# Patient Record
Sex: Male | Born: 1980 | Race: Black or African American | Hispanic: No | Marital: Married | Smoking: Former smoker
Health system: Southern US, Community
[De-identification: ages and names within clinical notes are randomized; demographics above are authoritative.]

## PROBLEM LIST (undated history)

## (undated) DIAGNOSIS — E785 Hyperlipidemia, unspecified: Secondary | ICD-10-CM

## (undated) DIAGNOSIS — E7800 Pure hypercholesterolemia, unspecified: Secondary | ICD-10-CM

## (undated) DIAGNOSIS — R200 Anesthesia of skin: Secondary | ICD-10-CM

## (undated) DIAGNOSIS — D649 Anemia, unspecified: Secondary | ICD-10-CM

## (undated) DIAGNOSIS — F988 Other specified behavioral and emotional disorders with onset usually occurring in childhood and adolescence: Secondary | ICD-10-CM

## (undated) DIAGNOSIS — I10 Essential (primary) hypertension: Secondary | ICD-10-CM

## (undated) DIAGNOSIS — N3281 Overactive bladder: Secondary | ICD-10-CM

## (undated) HISTORY — DX: Other specified behavioral and emotional disorders with onset usually occurring in childhood and adolescence: F98.8

## (undated) HISTORY — DX: Anemia, unspecified: D64.9

## (undated) HISTORY — DX: Hyperlipidemia, unspecified: E78.5

## (undated) HISTORY — DX: Overactive bladder: N32.81

## (undated) HISTORY — DX: Pure hypercholesterolemia, unspecified: E78.00

## (undated) HISTORY — DX: Anesthesia of skin: R20.0

## (undated) HISTORY — DX: Essential (primary) hypertension: I10

---

## 2003-04-22 ENCOUNTER — Encounter: Payer: Self-pay | Admitting: Emergency Medicine

## 2003-04-22 ENCOUNTER — Emergency Department (HOSPITAL_COMMUNITY): Admission: EM | Admit: 2003-04-22 | Discharge: 2003-04-22 | Payer: Self-pay | Admitting: Emergency Medicine

## 2003-05-01 ENCOUNTER — Emergency Department (HOSPITAL_COMMUNITY): Admission: EM | Admit: 2003-05-01 | Discharge: 2003-05-01 | Payer: Self-pay | Admitting: Emergency Medicine

## 2005-09-14 ENCOUNTER — Ambulatory Visit: Payer: Self-pay | Admitting: Family Medicine

## 2005-10-11 ENCOUNTER — Ambulatory Visit: Payer: Self-pay | Admitting: Family Medicine

## 2007-05-11 DIAGNOSIS — I10 Essential (primary) hypertension: Secondary | ICD-10-CM | POA: Insufficient documentation

## 2007-05-11 DIAGNOSIS — E785 Hyperlipidemia, unspecified: Secondary | ICD-10-CM | POA: Insufficient documentation

## 2012-04-08 ENCOUNTER — Emergency Department (HOSPITAL_COMMUNITY): Payer: No Typology Code available for payment source

## 2012-04-08 ENCOUNTER — Encounter (HOSPITAL_COMMUNITY): Payer: Self-pay | Admitting: *Deleted

## 2012-04-08 ENCOUNTER — Emergency Department (HOSPITAL_COMMUNITY)
Admission: EM | Admit: 2012-04-08 | Discharge: 2012-04-08 | Disposition: A | Discharge status: Home / Self Care | Payer: No Typology Code available for payment source | Attending: Emergency Medicine | Admitting: Emergency Medicine

## 2012-04-08 DIAGNOSIS — Y92410 Unspecified street and highway as the place of occurrence of the external cause: Secondary | ICD-10-CM | POA: Insufficient documentation

## 2012-04-08 DIAGNOSIS — S5001XA Contusion of right elbow, initial encounter: Secondary | ICD-10-CM

## 2012-04-08 DIAGNOSIS — S139XXA Sprain of joints and ligaments of unspecified parts of neck, initial encounter: Secondary | ICD-10-CM | POA: Insufficient documentation

## 2012-04-08 DIAGNOSIS — S161XXA Strain of muscle, fascia and tendon at neck level, initial encounter: Secondary | ICD-10-CM

## 2012-04-08 DIAGNOSIS — R29898 Other symptoms and signs involving the musculoskeletal system: Secondary | ICD-10-CM | POA: Insufficient documentation

## 2012-04-08 DIAGNOSIS — M25529 Pain in unspecified elbow: Secondary | ICD-10-CM | POA: Insufficient documentation

## 2012-04-08 DIAGNOSIS — S5000XA Contusion of unspecified elbow, initial encounter: Secondary | ICD-10-CM | POA: Insufficient documentation

## 2012-04-08 DIAGNOSIS — M542 Cervicalgia: Secondary | ICD-10-CM | POA: Insufficient documentation

## 2012-04-08 MED ORDER — HYDROCODONE-ACETAMINOPHEN 5-325 MG PO TABS: 1.0000 | ORAL_TABLET | ORAL | Status: AC | PRN

## 2012-04-08 MED ORDER — OXYCODONE-ACETAMINOPHEN 5-325 MG PO TABS
2.0000 | ORAL_TABLET | Freq: Once | ORAL | Status: AC
  Administered 2012-04-08: 2 via ORAL
  Filled 2012-04-08: qty 2
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
## 2019-09-28 ENCOUNTER — Ambulatory Visit: Payer: 59

## 2020-06-06 ENCOUNTER — Telehealth: Payer: Self-pay | Admitting: Psychologist

## 2020-10-03 ENCOUNTER — Ambulatory Visit: Payer: No Typology Code available for payment source | Admitting: Diagnostic Neuroimaging

## 2020-10-20 ENCOUNTER — Encounter: Payer: Self-pay | Admitting: *Deleted

## 2020-10-22 ENCOUNTER — Ambulatory Visit (INDEPENDENT_AMBULATORY_CARE_PROVIDER_SITE_OTHER): Payer: No Typology Code available for payment source | Admitting: Diagnostic Neuroimaging

## 2020-10-22 ENCOUNTER — Encounter: Payer: Self-pay | Admitting: Diagnostic Neuroimaging

## 2020-10-22 VITALS — BP 135/75 | HR 112 | Ht 70.0 in | Wt 349.4 lb

## 2020-10-22 DIAGNOSIS — G5712 Meralgia paresthetica, left lower limb: Secondary | ICD-10-CM

## 2020-10-22 NOTE — Progress Notes (Signed)
[facility]  PATIENT: Connor Dennis DOB: [birth year]  REFERRING CLINICIAN: Pearson Grippe, MD HISTORY FROM: patient  REASON FOR VISIT: new consult    HISTORICAL  CHIEF COMPLAINT:  Chief Complaint  Patient presents with   Left thigh numbness    Rm 7 New Pt "hx meniscus tear in left knee x 2; left thigh numbness x 1 year"     HISTORY OF PRESENT ILLNESS:   40 year old male here for evaluation of left thigh numbness.  Patient is had left thigh numbness for about 1 year.  He has tried yoga, nutrition, exercise and stretching without relief.  He does not have any significant low back pain.  No problems below his knee.  He feels some numbness and burning sensation with certain positions and stretches.   REVIEW OF SYSTEMS: Full 14 system review of systems performed and negative with exception of: As per HPI.  ALLERGIES: No Known Allergies  HOME MEDICATIONS: Outpatient Medications Prior to Visit  Medication Sig Dispense Refill   amLODipine (NORVASC) 10 MG tablet Take 10 mg by mouth daily.     amphetamine-dextroamphetamine (ADDERALL) 30 MG tablet Take 1 tablet by mouth daily.     No facility-administered medications prior to visit.    PAST MEDICAL HISTORY: Past Medical History:  Diagnosis Date   ADD (attention deficit disorder)    Anemia    Hypercholesteremia    Hyperlipidemia    Hypertension    Numbness    OAB (overactive bladder)     PAST SURGICAL HISTORY: No past surgical history on file.  FAMILY HISTORY: Family History  Problem Relation Age of Onset   Diabetes Father     SOCIAL HISTORY: Social History   Socioeconomic History   Marital status: Married    Spouse name: Faith   Number of children: 2   Years of education: Not on file   Highest education level: Bachelor's degree (e.g., BA, AB, BS)  Occupational History   Not on file  Tobacco Use   Smoking status: Former Smoker    Packs/day: 0.20    Years: 3.00    Pack  years: 0.60    Types: Cigarettes    Quit date: 04/08/2004    Years since quitting: 16.5   Smokeless tobacco: Never Used  Substance and Sexual Activity   Alcohol use: Yes    Alcohol/week: 3.0 standard drinks    Types: 3 Cans of beer per week    Comment: 2 days week   Drug use: No   Sexual activity: Yes    Birth control/protection: None  Other Topics Concern   Not on file  Social History Narrative   Lives with wife, sons   Caffeine- coffee 1 1/2 c daily, 8 oz tea   Social Determinants of Health   Financial Resource Strain: Not on file  Food Insecurity: Not on file  Transportation Needs: Not on file  Physical Activity: Not on file  Stress: Not on file  Social Connections: Not on file  Intimate Partner Violence: Not on file     PHYSICAL EXAM  GENERAL EXAM/CONSTITUTIONAL: Vitals:  Vitals:   10/22/20 1100  BP: 135/75  Pulse: (!) 112  Weight: (!) 349 lb 6.4 oz (158.5 kg)  Height: 5\' 10"  (1.778 m)   Body mass index is 50.13 kg/m. Wt Readings from Last 3 Encounters:  10/22/20 (!) 349 lb 6.4 oz (158.5 kg)  04/08/12 298 lb (135.2 kg)    Patient is in no distress; well developed, nourished and groomed;  neck is supple  CARDIOVASCULAR:  Examination of carotid arteries is normal; no carotid bruits  Regular rate and rhythm, no murmurs  Examination of peripheral vascular system by observation and palpation is normal  EYES:  Ophthalmoscopic exam of optic discs and posterior segments is normal; no papilledema or hemorrhages No exam data present  MUSCULOSKELETAL:  Gait, strength, tone, movements noted in Neurologic exam below  NEUROLOGIC: MENTAL STATUS:  No flowsheet data found.  awake, alert, oriented to person, place and time  recent and remote memory intact  normal attention and concentration  language fluent, comprehension intact, naming intact  fund of knowledge appropriate  CRANIAL NERVE:   2nd - no papilledema on fundoscopic exam  2nd, 3rd,  4th, 6th - pupils equal and reactive to light, visual fields full to confrontation, extraocular muscles intact, no nystagmus  5th - facial sensation symmetric  7th - facial strength symmetric  8th - hearing intact  9th - palate elevates symmetrically, uvula midline  11th - shoulder shrug symmetric  12th - tongue protrusion midline  MOTOR:   normal bulk and tone, full strength in the BUE, BLE  SENSORY:   normal and symmetric to light touch, temperature, vibration; DECREASED SENSATION IN LEFT ANTEROLATERAL THIGH  COORDINATION:   finger-nose-finger, fine finger movements normal  REFLEXES:   deep tendon reflexes present and symmetric  GAIT/STATION:   narrow based gait     DIAGNOSTIC DATA (LABS, IMAGING, TESTING) - I reviewed patient records, labs, notes, testing and imaging myself where available.  No results found for: WBC, HGB, HCT, MCV, PLT No results found for: NA, K, CL, CO2, GLUCOSE, BUN, CREATININE, CALCIUM, PROT, ALBUMIN, AST, ALT, ALKPHOS, BILITOT, GFRNONAA, GFRAA No results found for: CHOL, HDL, LDLCALC, LDLDIRECT, TRIG, CHOLHDL No results found for: BJSE8B No results found for: VITAMINB12 No results found for: TSH      ASSESSMENT AND PLAN  40 y.o. year old male here with obesity with BMI of 50, with left anterolateral thigh numbness, consistent with meralgia paresthetica.  Dx:  1. Meralgia paresthetica of left side       PLAN:  - Advised patient to continue to improve nutrition, exercise, weight loss and change in body composition will be the long-term solution for this; no significant pain therefore do not recommend pain medicines or injections. - Symptoms significant worsen or patient develops muscle weakness, could consider MRI lumbar spine to rule out other causes  Return for pending if symptoms worsen or fail to improve, return to PCP.    Suanne Marker, MD 10/22/2020, 1:43 PM Certified in Neurology, Neurophysiology and  Neuroimaging  [facility] [address] [place], [place] [address] [phone]

## 2020-10-22 NOTE — Patient Instructions (Signed)
Thank you for coming to see [place] at [facility]. I hope we have been able to provide you high quality care today.  You may receive a patient satisfaction survey over the next few weeks. We would appreciate your feedback and comments so that we may continue to improve ourselves and the health of our patients.   ~~~~~~~~~~~~~~~~~~~~~~~~~~~~~~~~~~~~~~~~~~~~~~~~~~~~~~~~~~~~~~~~~  DR. Abbagail Scaff'S GUIDE TO HAPPY AND HEALTHY LIVING These are some of my general health and wellness recommendations. Some of them may apply to you better than others. Please use common sense as you try these suggestions and feel free to ask me any questions.   ACTIVITY/FITNESS Mental, social, emotional and physical stimulation are very important for brain and body health. Try learning a new activity (arts, music, language, sports, games).  Keep moving your body to the best of your abilities. You can do this at home, inside or outside, the park, community center, gym or anywhere you like. Consider a physical therapist or personal trainer to get started. Fitness trackers, smart-watches or  smart-phones can help as well.   NUTRITION Eat more plants: colorful vegetables, nuts, seeds and berries.  Eat less sugar, salt, preservatives and processed foods.  Avoid toxins such as cigarettes and alcohol.  Drink water when you are thirsty. Warm water with a slice of lemon is an excellent morning drink to start the day.  Consider these websites for more information [employer] Nutrition [employer] (https://www.henry-hernandez.biz/) [employer] Nutrition (WindowBlog.ch)   RELAXATION Consider practicing mindfulness meditation or other relaxation techniques such as deep breathing, prayer, yoga, tai chi, massage. See website mindful.org or the apps Headspace or Calm to help get started.   SLEEP Try to get at least 7-8+ hours sleep per day. Regular exercise and reduced caffeine will  help you sleep better. Practice good sleep hygeine techniques. See website sleep.org for more information.

## 2020-10-24 DIAGNOSIS — M1712 Unilateral primary osteoarthritis, left knee: Secondary | ICD-10-CM | POA: Insufficient documentation

## 2020-11-11 ENCOUNTER — Other Ambulatory Visit: Payer: Self-pay | Admitting: Internal Medicine

## 2020-11-11 DIAGNOSIS — E785 Hyperlipidemia, unspecified: Secondary | ICD-10-CM

## 2020-12-31 ENCOUNTER — Ambulatory Visit
Admission: RE | Admit: 2020-12-31 | Discharge: 2020-12-31 | Disposition: A | Discharge status: Home / Self Care | Payer: No Typology Code available for payment source | Source: Ambulatory Visit | Attending: Internal Medicine | Admitting: Internal Medicine

## 2020-12-31 DIAGNOSIS — E785 Hyperlipidemia, unspecified: Secondary | ICD-10-CM

## 2021-09-10 ENCOUNTER — Inpatient Hospital Stay (HOSPITAL_BASED_OUTPATIENT_CLINIC_OR_DEPARTMENT_OTHER)
Admission: EM | Admit: 2021-09-10 | Discharge: 2021-09-13 | DRG: 638 | Disposition: A | Discharge status: Home / Self Care | Payer: No Typology Code available for payment source | Attending: Internal Medicine | Admitting: Internal Medicine

## 2021-09-10 ENCOUNTER — Encounter (HOSPITAL_BASED_OUTPATIENT_CLINIC_OR_DEPARTMENT_OTHER): Payer: Self-pay

## 2021-09-10 ENCOUNTER — Other Ambulatory Visit: Payer: Self-pay

## 2021-09-10 DIAGNOSIS — N3281 Overactive bladder: Secondary | ICD-10-CM | POA: Diagnosis present

## 2021-09-10 DIAGNOSIS — K219 Gastro-esophageal reflux disease without esophagitis: Secondary | ICD-10-CM | POA: Diagnosis present

## 2021-09-10 DIAGNOSIS — N179 Acute kidney failure, unspecified: Secondary | ICD-10-CM | POA: Diagnosis present

## 2021-09-10 DIAGNOSIS — E785 Hyperlipidemia, unspecified: Secondary | ICD-10-CM | POA: Diagnosis present

## 2021-09-10 DIAGNOSIS — E119 Type 2 diabetes mellitus without complications: Secondary | ICD-10-CM

## 2021-09-10 DIAGNOSIS — R5383 Other fatigue: Secondary | ICD-10-CM | POA: Diagnosis not present

## 2021-09-10 DIAGNOSIS — D509 Iron deficiency anemia, unspecified: Secondary | ICD-10-CM | POA: Diagnosis present

## 2021-09-10 DIAGNOSIS — E7800 Pure hypercholesterolemia, unspecified: Secondary | ICD-10-CM | POA: Diagnosis present

## 2021-09-10 DIAGNOSIS — Z20822 Contact with and (suspected) exposure to covid-19: Secondary | ICD-10-CM | POA: Diagnosis present

## 2021-09-10 DIAGNOSIS — E1110 Type 2 diabetes mellitus with ketoacidosis without coma: Principal | ICD-10-CM | POA: Diagnosis present

## 2021-09-10 DIAGNOSIS — Z87891 Personal history of nicotine dependence: Secondary | ICD-10-CM

## 2021-09-10 DIAGNOSIS — Z6841 Body Mass Index (BMI) 40.0 and over, adult: Secondary | ICD-10-CM

## 2021-09-10 DIAGNOSIS — Z79899 Other long term (current) drug therapy: Secondary | ICD-10-CM

## 2021-09-10 DIAGNOSIS — Z833 Family history of diabetes mellitus: Secondary | ICD-10-CM

## 2021-09-10 DIAGNOSIS — F988 Other specified behavioral and emotional disorders with onset usually occurring in childhood and adolescence: Secondary | ICD-10-CM

## 2021-09-10 DIAGNOSIS — I10 Essential (primary) hypertension: Secondary | ICD-10-CM | POA: Diagnosis present

## 2021-09-10 DIAGNOSIS — E876 Hypokalemia: Secondary | ICD-10-CM | POA: Diagnosis present

## 2021-09-10 LAB — I-STAT VENOUS BLOOD GAS, ED
Acid-base deficit: 12 mmol/L — ABNORMAL HIGH (ref 0.0–2.0)
Bicarbonate: 14.7 mmol/L — ABNORMAL LOW (ref 20.0–28.0)
Calcium, Ion: 1.28 mmol/L (ref 1.15–1.40)
HCT: 39 % (ref 39.0–52.0)
Hemoglobin: 13.3 g/dL (ref 13.0–17.0)
O2 Saturation: 67 %
Potassium: 4.8 mmol/L (ref 3.5–5.1)
Sodium: 130 mmol/L — ABNORMAL LOW (ref 135–145)
TCO2: 16 mmol/L — ABNORMAL LOW (ref 22–32)
pCO2, Ven: 33.6 mmHg — ABNORMAL LOW (ref 44.0–60.0)
pH, Ven: 7.247 — ABNORMAL LOW (ref 7.250–7.430)
pO2, Ven: 40 mmHg (ref 32.0–45.0)

## 2021-09-10 LAB — BASIC METABOLIC PANEL
Anion gap: 23 — ABNORMAL HIGH (ref 5–15)
BUN: 20 mg/dL (ref 6–20)
CO2: 14 mmol/L — ABNORMAL LOW (ref 22–32)
Calcium: 9.8 mg/dL (ref 8.9–10.3)
Chloride: 91 mmol/L — ABNORMAL LOW (ref 98–111)
Creatinine, Ser: 1.61 mg/dL — ABNORMAL HIGH (ref 0.61–1.24)
GFR, Estimated: 54 mL/min — ABNORMAL LOW (ref >60)
Glucose, Bld: 581 mg/dL (ref 70–99)
Potassium: 4.2 mmol/L (ref 3.5–5.1)
Sodium: 128 mmol/L — ABNORMAL LOW (ref 135–145)

## 2021-09-10 LAB — HEPATIC FUNCTION PANEL
ALT: 22 U/L (ref 0–44)
AST: 15 U/L (ref 15–41)
Albumin: 4.6 g/dL (ref 3.5–5.0)
Alkaline Phosphatase: 111 U/L (ref 38–126)
Bilirubin, Direct: 0.2 mg/dL (ref 0.0–0.2)
Indirect Bilirubin: 0.4 mg/dL (ref 0.3–0.9)
Total Bilirubin: 0.6 mg/dL (ref 0.3–1.2)
Total Protein: 8.3 g/dL — ABNORMAL HIGH (ref 6.5–8.1)

## 2021-09-10 LAB — URINALYSIS, ROUTINE W REFLEX MICROSCOPIC
Bilirubin Urine: NEGATIVE
Glucose, UA: >1000 mg/dL — AB
Ketones, ur: >80 mg/dL — AB
Leukocytes,Ua: NEGATIVE
Nitrite: NEGATIVE
Specific Gravity, Urine: 1.031 — ABNORMAL HIGH (ref 1.005–1.030)
pH: 5 (ref 5.0–8.0)

## 2021-09-10 LAB — CBC
HCT: 41.4 % (ref 39.0–52.0)
Hemoglobin: 14 g/dL (ref 13.0–17.0)
MCH: 25.3 pg — ABNORMAL LOW (ref 26.0–34.0)
MCHC: 33.8 g/dL (ref 30.0–36.0)
MCV: 74.7 fL — ABNORMAL LOW (ref 80.0–100.0)
Platelets: 302 10*3/uL (ref 150–400)
RBC: 5.54 MIL/uL (ref 4.22–5.81)
RDW: 14.6 % (ref 11.5–15.5)
WBC: 11.9 10*3/uL — ABNORMAL HIGH (ref 4.0–10.5)
nRBC: 0 % (ref 0.0–0.2)

## 2021-09-10 LAB — DIFFERENTIAL
Abs Immature Granulocytes: 0.03 10*3/uL (ref 0.00–0.07)
Basophils Absolute: 0.1 10*3/uL (ref 0.0–0.1)
Basophils Relative: 1 %
Eosinophils Absolute: 0.1 10*3/uL (ref 0.0–0.5)
Eosinophils Relative: 0 %
Immature Granulocytes: 0 %
Lymphocytes Relative: 27 %
Lymphs Abs: 3.2 10*3/uL (ref 0.7–4.0)
Monocytes Absolute: 0.7 10*3/uL (ref 0.1–1.0)
Monocytes Relative: 6 %
Neutro Abs: 7.8 10*3/uL — ABNORMAL HIGH (ref 1.7–7.7)
Neutrophils Relative %: 66 %

## 2021-09-10 LAB — CBG MONITORING, ED
Glucose-Capillary: 457 mg/dL — ABNORMAL HIGH (ref 70–99)
Glucose-Capillary: 516 mg/dL (ref 70–99)

## 2021-09-10 LAB — RESP PANEL BY RT-PCR (FLU A&B, COVID) ARPGX2
Influenza A by PCR: NEGATIVE
Influenza B by PCR: NEGATIVE
SARS Coronavirus 2 by RT PCR: NEGATIVE

## 2021-09-10 MED ORDER — LACTATED RINGERS IV BOLUS: 20.0000 mL/kg | Freq: Once | INTRAVENOUS | Status: DC

## 2021-09-10 MED ORDER — INSULIN REGULAR(HUMAN) IN NACL 100-0.9 UT/100ML-% IV SOLN
INTRAVENOUS | Status: DC
  Administered 2021-09-10: 15 [IU]/h via INTRAVENOUS
  Administered 2021-09-11: 2.6 [IU]/h via INTRAVENOUS
  Administered 2021-09-12: 4.2 [IU]/h via INTRAVENOUS
  Filled 2021-09-10 (×3): qty 100

## 2021-09-10 MED ORDER — DEXTROSE IN LACTATED RINGERS 5 % IV SOLN: INTRAVENOUS | Status: DC

## 2021-09-10 MED ORDER — DEXTROSE 50 % IV SOLN: 0.0000 mL | INTRAVENOUS | Status: DC | PRN

## 2021-09-10 MED ORDER — LACTATED RINGERS IV SOLN: INTRAVENOUS | Status: DC

## 2021-09-10 MED ORDER — LACTATED RINGERS IV BOLUS
1000.0000 mL | INTRAVENOUS | Status: AC
  Administered 2021-09-10 (×2): 1000 mL via INTRAVENOUS

## 2021-09-10 MED ORDER — POTASSIUM CHLORIDE 10 MEQ/100ML IV SOLN
10.0000 meq | INTRAVENOUS | Status: AC
  Administered 2021-09-10 – 2021-09-11 (×2): 10 meq via INTRAVENOUS
  Filled 2021-09-10 (×2): qty 100

## 2021-09-10 NOTE — ED Notes (Signed)
RT ran VBG with the following results.    Latest Reference Range & Units 09/10/21 22:39  Sample type  VENOUS  pH, Ven 7.250 - 7.430  7.247 (L)  pCO2, Ven 44.0 - 60.0 mmHg 33.6 (L)  pO2, Ven 32.0 - 45.0 mmHg 40.0  TCO2 22 - 32 mmol/L 16 (L)  Acid-base deficit 0.0 - 2.0 mmol/L 12.0 (H)  Bicarbonate 20.0 - 28.0 mmol/L 14.7 (L)  O2 Saturation % 67.0  Collection site  IV start

## 2021-09-10 NOTE — ED Triage Notes (Signed)
Patient here POV from Home with Fatigue.  Patient states for approximately 1.5 weeks he has been having Polydipsia and Polyuria associated with Fatigue.   No Pain. No Fevers. Constipation (attempted Enema with no Success). No N/V/D.  NAD Noted during Triage. A&Ox4. GCS 15. Ambulatory.

## 2021-09-10 NOTE — ED Provider Notes (Signed)
[place] EMERGENCY DEPT Provider Note   CSN: [id number] Arrival date & time: 09/10/21  2110     History  Chief Complaint  Patient presents with   Fatigue    Connor Dennis is a 41 y.o. male.  HPI     1 week of polydipsia, polyuria Fatigue Constipation, loose BM from enema was before arrival, one week ago was last true BM, had small pellets all week No vomiting, has had nausea No abdominal pain, reflux, "charcoaly" feeling, sometimes water doesn't taste good No chest pain No shortness of breath No dysuria No fever Cough, felt like reflux cough, intermittent  No runny nose Sore throat today, thought from reflux No rectal pain   Not on medications, had lost weight and DM appeared to be diet controlled   Past Medical History:  Diagnosis Date   ADD (attention deficit disorder)    Anemia    Hypercholesteremia    Hyperlipidemia    Hypertension    Numbness    OAB (overactive bladder)     Home Medications Prior to Admission medications   Medication Sig Start Date End Date Taking? Authorizing Provider  amLODipine (NORVASC) 10 MG tablet Take 10 mg by mouth every morning. 08/22/20  Yes [provider]  amphetamine-dextroamphetamine (ADDERALL) 30 MG tablet Take 30 mg by mouth daily as needed (to focus at work). 07/30/20  Yes [provider]  magnesium hydroxide (MILK OF MAGNESIA) 400 MG/5ML suspension Take 30 mLs by mouth daily as needed (constipation).   Yes [provider]  rosuvastatin (CRESTOR) 20 MG tablet Take 20 mg by mouth every morning. 07/07/21  Yes [provider]  sodium phosphate (FLEET) 7-19 GM/118ML ENEM Place 1 enema rectally once as needed for severe constipation.   Yes [provider]  Vitamin D, Ergocalciferol, (DRISDOL) 1.25 MG (50000 UNIT) CAPS capsule Take 50,000 Units by mouth every Wednesday. 08/06/21  Yes [provider]      Allergies    Patient has no known allergies.     Review of Systems   Review of Systems  Physical Exam Updated Vital Signs BP 125/66    Pulse 98    Temp 97.7 F (36.5 C) (Oral)    Resp (!) 21    Ht 5' 10"  (1.778 m)    Wt (!) 143.8 kg    SpO2 93%    BMI 45.48 kg/m  Physical Exam Vitals and nursing note reviewed.  Constitutional:      General: He is not in acute distress.    Appearance: He is well-developed. He is not diaphoretic.  HENT:     Head: Normocephalic and atraumatic.  Eyes:     Conjunctiva/sclera: Conjunctivae normal.  Cardiovascular:     Rate and Rhythm: Normal rate and regular rhythm.     Heart sounds: Normal heart sounds. No murmur heard.   No friction rub. No gallop.  Pulmonary:     Effort: Pulmonary effort is normal. No respiratory distress.     Breath sounds: Normal breath sounds. No wheezing or rales.  Abdominal:     General: There is no distension.     Palpations: Abdomen is soft.     Tenderness: There is no abdominal tenderness. There is no guarding.  Musculoskeletal:     Cervical back: Normal range of motion.  Skin:    General: Skin is warm and dry.  Neurological:     Mental Status: He is alert and oriented to person, place, and time.    ED  Results / Procedures / Treatments   Labs (all labs ordered are listed, but only abnormal results are displayed) Labs Reviewed  BASIC METABOLIC PANEL - Abnormal; Notable for the following components:      Result Value   Sodium 128 (*)    Chloride 91 (*)    CO2 14 (*)    Glucose, Bld 581 (*)    Creatinine, Ser 1.61 (*)    GFR, Estimated 54 (*)    Anion gap 23 (*)    All other components within normal limits  CBC - Abnormal; Notable for the following components:   WBC 11.9 (*)    MCV 74.7 (*)    MCH 25.3 (*)    All other components within normal limits  URINALYSIS, ROUTINE W REFLEX MICROSCOPIC - Abnormal; Notable for the following components:   Color, Urine COLORLESS (*)    Specific Gravity, Urine 1.031 (*)    Glucose, UA >1,000 (*)    Hgb urine  dipstick TRACE (*)    Ketones, ur >80 (*)    Protein, ur TRACE (*)    All other components within normal limits  DIFFERENTIAL - Abnormal; Notable for the following components:   Neutro Abs 7.8 (*)    All other components within normal limits  HEPATIC FUNCTION PANEL - Abnormal; Notable for the following components:   Total Protein 8.3 (*)    All other components within normal limits  BLOOD GAS, VENOUS - Abnormal; Notable for the following components:   pCO2, Ven 23.6 (*)    pO2, Ven 90.8 (*)    Bicarbonate 10.8 (*)    Acid-base deficit 14.2 (*)    All other components within normal limits  GLUCOSE, CAPILLARY - Abnormal; Notable for the following components:   Glucose-Capillary 383 (*)    All other components within normal limits  GLUCOSE, CAPILLARY - Abnormal; Notable for the following components:   Glucose-Capillary 295 (*)    All other components within normal limits  BASIC METABOLIC PANEL - Abnormal; Notable for the following components:   Sodium 133 (*)    CO2 10 (*)    Glucose, Bld 337 (*)    Creatinine, Ser 1.34 (*)    Calcium 8.8 (*)    Anion gap 19 (*)    All other components within normal limits  GLUCOSE, CAPILLARY - Abnormal; Notable for the following components:   Glucose-Capillary 246 (*)    All other components within normal limits  GLUCOSE, CAPILLARY - Abnormal; Notable for the following components:   Glucose-Capillary 215 (*)    All other components within normal limits  GLUCOSE, CAPILLARY - Abnormal; Notable for the following components:   Glucose-Capillary 200 (*)    All other components within normal limits  GLUCOSE, CAPILLARY - Abnormal; Notable for the following components:   Glucose-Capillary 147 (*)    All other components within normal limits  GLUCOSE, CAPILLARY - Abnormal; Notable for the following components:   Glucose-Capillary 132 (*)    All other components within normal limits  BASIC METABOLIC PANEL - Abnormal; Notable for the following  components:   Potassium 3.4 (*)    CO2 18 (*)    Glucose, Bld 154 (*)    Calcium 8.4 (*)    All other components within normal limits  BETA-HYDROXYBUTYRIC ACID - Abnormal; Notable for the following components:   Beta-Hydroxybutyric Acid 3.16 (*)    All other components within normal limits  CBC - Abnormal; Notable for the following components:   Hemoglobin 12.4 (*)  HCT 36.3 (*)    MCV 74.8 (*)    MCH 25.6 (*)    All other components within normal limits  GLUCOSE, CAPILLARY - Abnormal; Notable for the following components:   Glucose-Capillary 162 (*)    All other components within normal limits  GLUCOSE, CAPILLARY - Abnormal; Notable for the following components:   Glucose-Capillary 182 (*)    All other components within normal limits  GLUCOSE, CAPILLARY - Abnormal; Notable for the following components:   Glucose-Capillary 189 (*)    All other components within normal limits  GLUCOSE, CAPILLARY - Abnormal; Notable for the following components:   Glucose-Capillary 192 (*)    All other components within normal limits  CBG MONITORING, ED - Abnormal; Notable for the following components:   Glucose-Capillary 516 (*)    All other components within normal limits  I-STAT VENOUS BLOOD GAS, ED - Abnormal; Notable for the following components:   pH, Ven 7.247 (*)    pCO2, Ven 33.6 (*)    Bicarbonate 14.7 (*)    TCO2 16 (*)    Acid-base deficit 12.0 (*)    Sodium 130 (*)    All other components within normal limits  CBG MONITORING, ED - Abnormal; Notable for the following components:   Glucose-Capillary 457 (*)    All other components within normal limits  CBG MONITORING, ED - Abnormal; Notable for the following components:   Glucose-Capillary 403 (*)    All other components within normal limits  RESP PANEL BY RT-PCR (FLU A&B, COVID) ARPGX2  MRSA NEXT GEN BY PCR, NASAL  PHOSPHORUS  MAGNESIUM  BETA-HYDROXYBUTYRIC ACID  HEMOGLOBIN T0V  BASIC METABOLIC PANEL  BASIC METABOLIC  PANEL  BASIC METABOLIC PANEL  BASIC METABOLIC PANEL  BETA-HYDROXYBUTYRIC ACID  BETA-HYDROXYBUTYRIC ACID  VITAMIN B12  FOLATE  IRON AND TIBC  FERRITIN  RETICULOCYTES  CBG MONITORING, ED  TROPONIN I (HIGH SENSITIVITY)  TROPONIN I (HIGH SENSITIVITY)    EKG EKG Interpretation  Date/Time:  Thursday September 10 2021 21:42:33 EST Ventricular Rate:  104 PR Interval:  149 QRS Duration: 91 QT Interval:  346 QTC Calculation: 456 R Axis:   6 Text Interpretation: Sinus tachycardia LAE, consider biatrial enlargement No previous ECGs available Confirmed by Gareth Morgan [phone]) on 09/11/2021 11:52:05 AM  Radiology No results found.  Procedures .Critical Care Performed by: Gareth Morgan, MD Authorized by: Gareth Morgan, MD   Critical care provider statement:    Critical care time (minutes):  30   Critical care was time spent personally by me on the following activities:  Development of treatment plan with patient or surrogate, evaluation of patient's response to treatment, examination of patient, ordering and review of laboratory studies, ordering and review of radiographic studies, ordering and performing treatments and interventions, pulse oximetry, re-evaluation of patient's condition and review of old charts    Medications Ordered in ED Medications  lactated ringers infusion ( Intravenous Infusion Verify 09/11/21 0305)  insulin regular, human (MYXREDLIN) 100 units/ 100 mL infusion (5.5 Units/hr Intravenous Infusion Verify 09/11/21 1100)  dextrose 5 % in lactated ringers infusion ( Intravenous New Bag/Given 09/11/21 1126)  dextrose 50 % solution 0-50 mL (has no administration in time range)  Chlorhexidine Gluconate Cloth 2 % PADS 6 each (6 each Topical Given 09/11/21 0928)  enoxaparin (LOVENOX) injection 60 mg (60 mg Subcutaneous Given 09/11/21 0928)  acetaminophen (TYLENOL) tablet 650 mg (has no administration in time range)    Or  acetaminophen (TYLENOL) suppository 650 mg  (has no administration  in time range)  ondansetron (ZOFRAN) tablet 4 mg (has no administration in time range)    Or  ondansetron (ZOFRAN) injection 4 mg (has no administration in time range)  lactated ringers bolus 1,000 mL (0 mLs Intravenous Stopped 09/10/21 2346)  potassium chloride 10 mEq in 100 mL IVPB (0 mEq Intravenous Stopped 09/11/21 0200)  living well with diabetes book MISC ( Does not apply Given 09/11/21 0930)  insulin starter kit- pen needles (English) 1 kit (1 kit Other Given 09/11/21 0930)  potassium chloride 10 mEq in 100 mL IVPB ( Intravenous Infusion Verify 09/11/21 1100)    ED Course/ Medical Decision Making/ A&P   41yo male with history of hypertension, hyperlipidemia, DM type 2 (previously thought to be improved, diet controlled) presents with concern for fatigue, polydypsia, polyuria.   Also reports some constipation. Abdominal exam benign, nontender, no rectal pain, no fever, tolerating po, having small BM, do not suspect SBO, appendicitis, cholecystitis, pancreatitis and do not feel further imaging needed at this time. Constipation can be also treated with oral agents. Discomfort and nausea likely secondary to DKA.  Has reflux symptoms, EKG and troponin evaluated by me and show no sign of ACS.    Labs interpreted by me show DKA with glucose of 581, bicarb 14, AB 23, urine ketones, pH 7.247 with pco2 33.   Initiated insulin gtt, K, given IV fluid.  Will admit for DKA. Discussed with patient and wife.            Final Clinical Impression(s) / ED Diagnoses Final diagnoses:  Diabetic ketoacidosis without coma associated with type 2 diabetes mellitus ([place])    Rx / DC Orders ED Discharge Orders     None         Gareth Morgan, MD 09/11/21 1201

## 2021-09-11 ENCOUNTER — Encounter (HOSPITAL_COMMUNITY): Payer: Self-pay | Admitting: Internal Medicine

## 2021-09-11 DIAGNOSIS — E1110 Type 2 diabetes mellitus with ketoacidosis without coma: Principal | ICD-10-CM

## 2021-09-11 DIAGNOSIS — K219 Gastro-esophageal reflux disease without esophagitis: Secondary | ICD-10-CM | POA: Diagnosis present

## 2021-09-11 DIAGNOSIS — D509 Iron deficiency anemia, unspecified: Secondary | ICD-10-CM | POA: Diagnosis present

## 2021-09-11 DIAGNOSIS — E785 Hyperlipidemia, unspecified: Secondary | ICD-10-CM | POA: Diagnosis not present

## 2021-09-11 DIAGNOSIS — N179 Acute kidney failure, unspecified: Secondary | ICD-10-CM | POA: Diagnosis present

## 2021-09-11 DIAGNOSIS — N3281 Overactive bladder: Secondary | ICD-10-CM | POA: Diagnosis present

## 2021-09-11 DIAGNOSIS — E7800 Pure hypercholesterolemia, unspecified: Secondary | ICD-10-CM | POA: Diagnosis present

## 2021-09-11 DIAGNOSIS — R5383 Other fatigue: Secondary | ICD-10-CM | POA: Diagnosis present

## 2021-09-11 DIAGNOSIS — F988 Other specified behavioral and emotional disorders with onset usually occurring in childhood and adolescence: Secondary | ICD-10-CM | POA: Diagnosis present

## 2021-09-11 DIAGNOSIS — E119 Type 2 diabetes mellitus without complications: Secondary | ICD-10-CM | POA: Diagnosis not present

## 2021-09-11 DIAGNOSIS — Z79899 Other long term (current) drug therapy: Secondary | ICD-10-CM | POA: Diagnosis not present

## 2021-09-11 DIAGNOSIS — Z87891 Personal history of nicotine dependence: Secondary | ICD-10-CM | POA: Diagnosis not present

## 2021-09-11 DIAGNOSIS — E876 Hypokalemia: Secondary | ICD-10-CM

## 2021-09-11 DIAGNOSIS — I10 Essential (primary) hypertension: Secondary | ICD-10-CM | POA: Diagnosis present

## 2021-09-11 DIAGNOSIS — Z20822 Contact with and (suspected) exposure to covid-19: Secondary | ICD-10-CM | POA: Diagnosis present

## 2021-09-11 DIAGNOSIS — Z6841 Body Mass Index (BMI) 40.0 and over, adult: Secondary | ICD-10-CM | POA: Diagnosis not present

## 2021-09-11 DIAGNOSIS — Z833 Family history of diabetes mellitus: Secondary | ICD-10-CM | POA: Diagnosis not present

## 2021-09-11 LAB — GLUCOSE, CAPILLARY
Glucose-Capillary: 383 mg/dL — ABNORMAL HIGH (ref 70–99)
Glucose-Capillary: 295 mg/dL — ABNORMAL HIGH (ref 70–99)
Glucose-Capillary: 246 mg/dL — ABNORMAL HIGH (ref 70–99)
Glucose-Capillary: 200 mg/dL — ABNORMAL HIGH (ref 70–99)
Glucose-Capillary: 215 mg/dL — ABNORMAL HIGH (ref 70–99)
Glucose-Capillary: 147 mg/dL — ABNORMAL HIGH (ref 70–99)
Glucose-Capillary: 132 mg/dL — ABNORMAL HIGH (ref 70–99)
Glucose-Capillary: 162 mg/dL — ABNORMAL HIGH (ref 70–99)
Glucose-Capillary: 182 mg/dL — ABNORMAL HIGH (ref 70–99)
Glucose-Capillary: 189 mg/dL — ABNORMAL HIGH (ref 70–99)
Glucose-Capillary: 192 mg/dL — ABNORMAL HIGH (ref 70–99)
Glucose-Capillary: 195 mg/dL — ABNORMAL HIGH (ref 70–99)
Glucose-Capillary: 173 mg/dL — ABNORMAL HIGH (ref 70–99)
Glucose-Capillary: 186 mg/dL — ABNORMAL HIGH (ref 70–99)
Glucose-Capillary: 146 mg/dL — ABNORMAL HIGH (ref 70–99)
Glucose-Capillary: 153 mg/dL — ABNORMAL HIGH (ref 70–99)
Glucose-Capillary: 124 mg/dL — ABNORMAL HIGH (ref 70–99)
Glucose-Capillary: 148 mg/dL — ABNORMAL HIGH (ref 70–99)
Glucose-Capillary: 159 mg/dL — ABNORMAL HIGH (ref 70–99)
Glucose-Capillary: 147 mg/dL — ABNORMAL HIGH (ref 70–99)

## 2021-09-11 LAB — BASIC METABOLIC PANEL
Anion gap: 19 — ABNORMAL HIGH (ref 5–15)
Anion gap: 12 (ref 5–15)
Anion gap: 10 (ref 5–15)
Anion gap: 8 (ref 5–15)
Anion gap: 8 (ref 5–15)
BUN: 18 mg/dL (ref 6–20)
BUN: 15 mg/dL (ref 6–20)
BUN: 13 mg/dL (ref 6–20)
BUN: 11 mg/dL (ref 6–20)
BUN: 10 mg/dL (ref 6–20)
CO2: 10 mmol/L — ABNORMAL LOW (ref 22–32)
CO2: 18 mmol/L — ABNORMAL LOW (ref 22–32)
CO2: 19 mmol/L — ABNORMAL LOW (ref 22–32)
CO2: 20 mmol/L — ABNORMAL LOW (ref 22–32)
CO2: 21 mmol/L — ABNORMAL LOW (ref 22–32)
Calcium: 8.8 mg/dL — ABNORMAL LOW (ref 8.9–10.3)
Calcium: 8.4 mg/dL — ABNORMAL LOW (ref 8.9–10.3)
Calcium: 8.1 mg/dL — ABNORMAL LOW (ref 8.9–10.3)
Calcium: 8 mg/dL — ABNORMAL LOW (ref 8.9–10.3)
Calcium: 8 mg/dL — ABNORMAL LOW (ref 8.9–10.3)
Chloride: 104 mmol/L (ref 98–111)
Chloride: 106 mmol/L (ref 98–111)
Chloride: 104 mmol/L (ref 98–111)
Chloride: 105 mmol/L (ref 98–111)
Chloride: 103 mmol/L (ref 98–111)
Creatinine, Ser: 1.34 mg/dL — ABNORMAL HIGH (ref 0.61–1.24)
Creatinine, Ser: 1.1 mg/dL (ref 0.61–1.24)
Creatinine, Ser: 0.93 mg/dL (ref 0.61–1.24)
Creatinine, Ser: 1.02 mg/dL (ref 0.61–1.24)
Creatinine, Ser: 0.93 mg/dL (ref 0.61–1.24)
GFR, Estimated: >60 mL/min (ref >60)
GFR, Estimated: >60 mL/min (ref >60)
GFR, Estimated: >60 mL/min (ref >60)
GFR, Estimated: >60 mL/min (ref >60)
GFR, Estimated: >60 mL/min (ref >60)
Glucose, Bld: 337 mg/dL — ABNORMAL HIGH (ref 70–99)
Glucose, Bld: 154 mg/dL — ABNORMAL HIGH (ref 70–99)
Glucose, Bld: 199 mg/dL — ABNORMAL HIGH (ref 70–99)
Glucose, Bld: 159 mg/dL — ABNORMAL HIGH (ref 70–99)
Glucose, Bld: 151 mg/dL — ABNORMAL HIGH (ref 70–99)
Potassium: 3.6 mmol/L (ref 3.5–5.1)
Potassium: 3.4 mmol/L — ABNORMAL LOW (ref 3.5–5.1)
Potassium: 3.6 mmol/L (ref 3.5–5.1)
Potassium: 3.1 mmol/L — ABNORMAL LOW (ref 3.5–5.1)
Potassium: 2.9 mmol/L — ABNORMAL LOW (ref 3.5–5.1)
Sodium: 133 mmol/L — ABNORMAL LOW (ref 135–145)
Sodium: 136 mmol/L (ref 135–145)
Sodium: 133 mmol/L — ABNORMAL LOW (ref 135–145)
Sodium: 133 mmol/L — ABNORMAL LOW (ref 135–145)
Sodium: 132 mmol/L — ABNORMAL LOW (ref 135–145)

## 2021-09-11 LAB — BLOOD GAS, VENOUS
Acid-base deficit: 14.2 mmol/L — ABNORMAL HIGH (ref 0.0–2.0)
Bicarbonate: 10.8 mmol/L — ABNORMAL LOW (ref 20.0–28.0)
O2 Saturation: 95.7 %
Patient temperature: 98.6
pCO2, Ven: 23.6 mmHg — ABNORMAL LOW (ref 44.0–60.0)
pH, Ven: 7.282 (ref 7.250–7.430)
pO2, Ven: 90.8 mmHg — ABNORMAL HIGH (ref 32.0–45.0)

## 2021-09-11 LAB — TROPONIN I (HIGH SENSITIVITY)
Troponin I (High Sensitivity): 4 ng/L (ref <18)
Troponin I (High Sensitivity): 4 ng/L (ref <18)

## 2021-09-11 LAB — RETICULOCYTES
Immature Retic Fract: 11.2 % (ref 2.3–15.9)
RBC.: 4.8 MIL/uL (ref 4.22–5.81)
Retic Count, Absolute: 72.5 10*3/uL (ref 19.0–186.0)
Retic Ct Pct: 1.5 % (ref 0.4–3.1)

## 2021-09-11 LAB — VITAMIN B12: Vitamin B-12: 270 pg/mL (ref 180–914)

## 2021-09-11 LAB — BETA-HYDROXYBUTYRIC ACID
Beta-Hydroxybutyric Acid: >8 mmol/L — ABNORMAL HIGH (ref 0.05–0.27)
Beta-Hydroxybutyric Acid: 3.16 mmol/L — ABNORMAL HIGH (ref 0.05–0.27)
Beta-Hydroxybutyric Acid: 2.09 mmol/L — ABNORMAL HIGH (ref 0.05–0.27)

## 2021-09-11 LAB — CBG MONITORING, ED: Glucose-Capillary: 403 mg/dL — ABNORMAL HIGH (ref 70–99)

## 2021-09-11 LAB — IRON AND TIBC
Iron: 126 ug/dL (ref 45–182)
Saturation Ratios: 53 % — ABNORMAL HIGH (ref 17.9–39.5)
TIBC: 240 ug/dL — ABNORMAL LOW (ref 250–450)
UIBC: 114 ug/dL

## 2021-09-11 LAB — CBC
HCT: 36.3 % — ABNORMAL LOW (ref 39.0–52.0)
Hemoglobin: 12.4 g/dL — ABNORMAL LOW (ref 13.0–17.0)
MCH: 25.6 pg — ABNORMAL LOW (ref 26.0–34.0)
MCHC: 34.2 g/dL (ref 30.0–36.0)
MCV: 74.8 fL — ABNORMAL LOW (ref 80.0–100.0)
Platelets: 252 10*3/uL (ref 150–400)
RBC: 4.85 MIL/uL (ref 4.22–5.81)
RDW: 14.6 % (ref 11.5–15.5)
WBC: 10.5 10*3/uL (ref 4.0–10.5)
nRBC: 0 % (ref 0.0–0.2)

## 2021-09-11 LAB — FOLATE: Folate: 14.1 ng/mL (ref >5.9)

## 2021-09-11 LAB — MAGNESIUM: Magnesium: 2.3 mg/dL (ref 1.7–2.4)

## 2021-09-11 LAB — FERRITIN: Ferritin: 302 ng/mL (ref 24–336)

## 2021-09-11 LAB — PHOSPHORUS: Phosphorus: 3.3 mg/dL (ref 2.5–4.6)

## 2021-09-11 LAB — MRSA NEXT GEN BY PCR, NASAL: MRSA by PCR Next Gen: NOT DETECTED

## 2021-09-11 MED ORDER — ONDANSETRON HCL 4 MG/2ML IJ SOLN: 4.0000 mg | Freq: Four times a day (QID) | INTRAMUSCULAR | Status: DC | PRN

## 2021-09-11 MED ORDER — CHLORHEXIDINE GLUCONATE CLOTH 2 % EX PADS
6.0000 | MEDICATED_PAD | Freq: Every day | CUTANEOUS | Status: DC
  Administered 2021-09-11 – 2021-09-12 (×2): 6 via TOPICAL

## 2021-09-11 MED ORDER — INSULIN STARTER KIT- PEN NEEDLES (ENGLISH)
1.0000 | Freq: Once | Status: AC
  Administered 2021-09-11: 1
  Filled 2021-09-11: qty 1

## 2021-09-11 MED ORDER — POTASSIUM CHLORIDE 10 MEQ/100ML IV SOLN
10.0000 meq | INTRAVENOUS | Status: AC
  Administered 2021-09-11 (×2): 10 meq via INTRAVENOUS
  Filled 2021-09-11 (×2): qty 100

## 2021-09-11 MED ORDER — ACETAMINOPHEN 650 MG RE SUPP: 650.0000 mg | Freq: Four times a day (QID) | RECTAL | Status: DC | PRN

## 2021-09-11 MED ORDER — LIVING WELL WITH DIABETES BOOK
Freq: Once | Status: AC
  Filled 2021-09-11: qty 1

## 2021-09-11 MED ORDER — ENOXAPARIN SODIUM 60 MG/0.6ML IJ SOSY
60.0000 mg | PREFILLED_SYRINGE | INTRAMUSCULAR | Status: DC
  Administered 2021-09-11 – 2021-09-13 (×3): 60 mg via SUBCUTANEOUS
  Filled 2021-09-11 (×3): qty 0.6

## 2021-09-11 MED ORDER — POTASSIUM CHLORIDE 20 MEQ PO PACK
60.0000 meq | PACK | Freq: Once | ORAL | Status: AC
  Administered 2021-09-11: 60 meq via ORAL
  Filled 2021-09-11: qty 3

## 2021-09-11 MED ORDER — SODIUM CHLORIDE 0.9 % IV SOLN: INTRAVENOUS | Status: DC | PRN

## 2021-09-11 MED ORDER — ACETAMINOPHEN 325 MG PO TABS: 650.0000 mg | ORAL_TABLET | Freq: Four times a day (QID) | ORAL | Status: DC | PRN

## 2021-09-11 MED ORDER — ONDANSETRON HCL 4 MG PO TABS: 4.0000 mg | ORAL_TABLET | Freq: Four times a day (QID) | ORAL | Status: DC | PRN

## 2021-09-11 NOTE — Plan of Care (Signed)
°  Problem: Cardiac: Goal: Ability to maintain an adequate cardiac output will improve Outcome: Progressing   Problem: Metabolic: Goal: Ability to maintain appropriate glucose levels will improve Outcome: Progressing   Problem: Nutritional: Goal: Maintenance of adequate weight for body size and type will improve Outcome: Not Progressing

## 2021-09-11 NOTE — H&P (Signed)
History and Physical    Connor Dennis [id number] DOB: Mar 26, 1981 DOA: 09/10/2021  PCP: [facility]  Patient coming from: Home.  I have personally briefly reviewed patient's old medical records in [place]  Chief Complaint: Increased thirst and urination.  HPI: Connor Dennis is a 41 y.o. male with medical history significant of ADD, anemia, hypercholesterolemia, hypertension, unspecified numbness, overactive bladder, class III obesity, prediabetes diagnosed about a year ago who is coming to the emergency department due to polyuria, polydipsia associated with fatigue and nausea for the past week and a half.  His vision has been blurred at times.  No fever, chills, sore throat, productive cough, dyspnea, chest pain, palpitations, diaphoresis, PND or recent lower extremity edema.  He has some positional lightheadedness.  He has burning abdominal pain which has already resolved with treatment and this morning bowel movement.  No flank pain, dysuria, frequency or hematuria.  ED Course: Initial vital signs were temperature 97.9 F, pulse 113, respiration 20, BP 136/94 mmHg and O2 sat 100% on room air.  Patient was given IV fluids, potassium replacement and was started on an insulin infusion.  Lab work: His urinalysis showed glucosuria more than 1000 and ketonuria more than 80 mg/dL.  CBC with a white count 11.9, hemoglobin 14.0 and platelets 302.  Follow-up CBC after hydration showed a hemoglobin of 12.4 g/dL.  Initial pH was 7.25 with a decreased PCO2 and bicarbonate.  LFTs show total protein of 8.3 g/dL but was otherwise normal.  BMP showed a sodium 128, potassium 4.2, chloride 91 and CO2 14 mmol/L.  Anion gap was 23.  Glucose 581, BUN 20 and creatinine 1.61 mg/dL.  Review of Systems: As per HPI otherwise all other systems reviewed and are negative.  Past Medical History:  Diagnosis Date   ADD (attention deficit disorder)    Anemia    Hypercholesteremia     Hyperlipidemia    Hypertension    Numbness    OAB (overactive bladder)     History reviewed. No pertinent surgical history.  Social History  reports that he quit smoking about 17 years ago. His smoking use included cigarettes. He has a 0.60 pack-year smoking history. He has never used smokeless tobacco. He reports current alcohol use of about 3.0 standard drinks per week. He reports that he does not use drugs.  No Known Allergies  Family History  Problem Relation Age of Onset   Diabetes Father    Prior to Admission medications   Medication Sig Start Date End Date Taking? Authorizing Provider  amLODipine (NORVASC) 10 MG tablet Take 10 mg by mouth daily. 08/22/20   [provider]  amphetamine-dextroamphetamine (ADDERALL) 30 MG tablet Take 1 tablet by mouth daily. 07/30/20   [provider]    Physical Exam: Vitals:   09/11/21 0400 09/11/21 0553 09/11/21 0700 09/11/21 0800  BP: (!) 86/55  (!) 98/56 100/64  Pulse: (!) 107 95 97 97  Resp: 17 18 15 16   Temp: 98.5 F (36.9 C)     TempSrc: Oral     SpO2: 90% 97% 98% 96%  Weight:      Height:       Constitutional: NAD, calm, comfortable Eyes: PERRL, lids and conjunctivae normal ENMT: Mucous membranes are dry.  Posterior pharynx clear of any exudate or lesions. Neck: normal, supple, no masses, no thyromegaly Respiratory: clear to auscultation bilaterally, no wheezing, no crackles. Normal respiratory effort. No accessory muscle use.  Cardiovascular: Regular rate and rhythm, no  murmurs / rubs / gallops. No extremity edema. 2+ pedal pulses. No carotid bruits.  Abdomen: Obese, no distention.  No tenderness, no masses palpated. No hepatosplenomegaly. Bowel sounds positive.  Musculoskeletal: no clubbing / cyanosis.  Good ROM, no contractures. Normal muscle tone.  Skin: no rashes, lesions, ulcers. No induration Neurologic: CN 2-12 grossly intact. Sensation intact, DTR normal. Strength 5/5 in all 4.  Psychiatric: Normal  judgment and insight. Alert and oriented x 3. Normal mood.   Labs on Admission: I have personally reviewed following labs and imaging studies  CBC: Recent Labs  Lab 09/10/21 2125 09/10/21 2239 09/11/21 0800  WBC 11.9*  --  10.5  NEUTROABS 7.8*  --   --   HGB 14.0 13.3 12.4*  HCT 41.4 39.0 36.3*  MCV 74.7*  --  74.8*  PLT 302  --  [id number]    Basic Metabolic Panel: Recent Labs  Lab 09/10/21 2125 09/10/21 2239 09/11/21 0134 09/11/21 0800  NA 128* 130* 133* 136  K 4.2 4.8 3.6 3.4*  CL 91*  --  104 106  CO2 14*  --  10* 18*  GLUCOSE 581*  --  337* 154*  BUN 20  --  18 15  CREATININE 1.61*  --  1.34* 1.10  CALCIUM 9.8  --  8.8* 8.4*  MG  --   --   --  2.3  PHOS  --   --   --  3.3    GFR: Estimated Creatinine Clearance: 125.3 mL/min (by C-G formula based on SCr of 1.1 mg/dL).  Liver Function Tests: Recent Labs  Lab 09/10/21 2125  AST 15  ALT 22  ALKPHOS 111  BILITOT 0.6  PROT 8.3*  ALBUMIN 4.6    Urine analysis:    Component Value Date/Time   COLORURINE COLORLESS (A) 09/10/2021 2125   APPEARANCEUR CLEAR 09/10/2021 2125   LABSPEC 1.031 (H) 09/10/2021 2125   PHURINE 5.0 09/10/2021 2125   GLUCOSEU >1,000 (A) 09/10/2021 2125   HGBUR TRACE (A) 09/10/2021 2125   BILIRUBINUR NEGATIVE 09/10/2021 2125   KETONESUR >80 (A) 09/10/2021 2125   PROTEINUR TRACE (A) 09/10/2021 2125   NITRITE NEGATIVE 09/10/2021 2125   LEUKOCYTESUR NEGATIVE 09/10/2021 2125    Radiological Exams on Admission: No results found.  EKG: Independently reviewed.  Vent. rate 104 BPM PR interval 149 ms QRS duration 91 ms QT/QTcB 346/456 ms P-R-T axes 58 6 28 Sinus tachycardia LAE, consider biatrial enlargement  Assessment/Plan Principal Problem:   DKA, type 2 (HCC) Admit to stepdown/inpatient. Keep NPO. May have ice chips/small sips of water. Continue IV fluids. Continue insulin infusion. Monitor and correct electrolytes as needed. Transition to SQ insulin once AG closed/BHA  normalized. Consult diabetes coordinator. Lifestyle modifications advised.  Active Problems:   Hyperlipidemia Resumed rosuvastatin once cleared for oral intake. Follow-up with primary care provider.    Essential hypertension Prednisone amlodipine once oral intake tolerated. Monitor blood pressure and heart rate.    Hypokalemia Replacing.    Class 3 obesity (HCC) Lifestyle modifications. Follow-up with primary care provider.    Microcytic anemia Check anemia panel. Monitor H&H.    DVT prophylaxis: SCDs. Code Status:   Full code. Family Communication:   Disposition Plan:   Patient is from:  Home.  Anticipated DC to:  Home.  Anticipated DC date:  09/12/2021.  Anticipated DC barriers: Clinical status.  Consults called:   Admission status:  Stepdown/inpatient.  Severity of Illness: High severity in the setting of DKA. Reubin Milan MD [facility]  How to contact the [facility] Attending or Consulting provider [place] or covering provider during after hours [place], for this patient?   Check the care team in [facility] and look for a) attending/consulting TRH provider listed and b) the [facility] team listed Log into [email or web address] and use [place]'s universal password to access. If you do not have the password, please contact the hospital operator. Locate the [facility] provider you are looking for under [facility] and page to a number that you can be directly reached. If you still have difficulty reaching the provider, please page the [facility] (Director on Call) for the Hospitalists listed on amion for assistance.  09/11/2021, 8:53 AM   This document was prepared using Dragon voice recognition software and may contain some unintended transcription errors.

## 2021-09-11 NOTE — Progress Notes (Signed)
°  Transition of Care [facility]) Screening Note   Patient Details  Name: Connor Dennis Date of Birth: 1981-03-31   Transition of Care [facility]) CM/SW Contact:    Amada Jupiter, LCSW Phone Number: 09/11/2021, 9:34 AM    Transition of Care Department [facility]) has reviewed patient and no TOC needs have been identified at this time. We will continue to monitor patient advancement through interdisciplinary progression rounds. If new patient transition needs arise, please place a TOC consult.

## 2021-09-11 NOTE — ED Notes (Signed)
Called [facility] to transport patient to [facility] ICU--[phone]

## 2021-09-11 NOTE — Progress Notes (Signed)
NUTRITION NOTE  RD consulted for nutrition education regarding diabetes.   No results found for: HGBA1C  RD provided "Carbohydrate Counting for People with Diabetes" and "Label Reading Tips for Diabetes" handouts from the Academy of Nutrition and Dietetics. Discussed different food groups and their effects on blood sugar, emphasizing carbohydrate-containing foods. Provided list of carbohydrates and recommended serving sizes of common foods.  Discussed importance of controlled and consistent carbohydrate intake throughout the day. Provided examples of ways to balance meals/snacks and encouraged intake of high-fiber, whole grain complex carbohydrates. Teach back method used.  Patient reports he was informed ~1 year ago that he had pre-diabetes. Patient began walking, using [place], and decreasing food portions. He reports that he lost weight from 352 to 317 lb over the past year after making these changes.   Expect good compliance.  Body mass index is 45.48 kg/m. Pt meets criteria for morbid obesity based on current BMI.  Current diet order is NPO and he has been NPO since admission. Labs and medications reviewed.   No further nutrition interventions warranted at this time. RD contact information provided. If additional nutrition issues arise, please re-consult RD.      Trenton Gammon, MS, RD, LDN Inpatient Clinical Dietitian RD pager # available in AMION  After hours/weekend pager # available in [facility]

## 2021-09-11 NOTE — Progress Notes (Addendum)
Inpatient Diabetes Program Recommendations  AACE/ADA: New Consensus Statement on Inpatient Glycemic Control (2015)  Target Ranges:  Prepandial:   less than 140 mg/dL      Peak postprandial:   less than 180 mg/dL (1-2 hours)      Critically ill patients:  140 - 180 mg/dL   Lab Results  Component Value Date   GLUCAP 132 (H) 09/11/2021    Review of Glycemic Control  Diabetes history: New DM? Outpatient Diabetes medications: None Current orders for Inpatient glycemic control: IV insulin  Inpatient Diabetes Program Recommendations:    When MD is ready to transition to SQ insulin and criteria has been met, please consider:  1) Semglee 22 units QD (0.15 units/kg) to start 2 hours prior to discontinuing IV insulin 2) Novolog 0-15 units TID with meals and 0-5 units QHS  Ordered LWWD, insulin starter kit, DM education, RD consult and attached education to AVS.  Will see patient this morning.    Addendum@ 12:57- Spoke with patient at bedside.  He states he was told he had Pre-DM about 1 year ago.  He has worn an [employer] in the past to track glucose.  His A1C was around 6% 2 weeks ago with PCP.  Current A1C is pending.  He remains on IV insulin at 6 units/hr.    Spoke with pt about new diagnosis. Discussed A1C results with them and explained what an A1C is, basic pathophysiology of DM Type 2, basic home care, basic diabetes diet nutrition principles, importance of checking CBGs and maintaining good CBG control to prevent long-term and short-term complications. Reviewed signs and symptoms of hyperglycemia and hypoglycemia and how to treat hypoglycemia at home. Also reviewed blood sugar goals at home.   Discussed probable need for insulin.  Demonstrated the insulin pen.  LWWD booklet is at bedside along with in the insulin starter kit.    Please use each patient interaction to provide diabetes education. Please review Living Well with Diabetes booklet with the patient, have patient watch  patient education videos on diabetes, and instruct on insulin administration. Please allow patient to be actively engaged with diabetes management by allowing patient to check own glucose and self-administer insulin injections.   Discussed hypoglycemia, signs, symptoms and treatments.  He will call and make an appointment with PCP for next week.    Please order glucometer at DC, order # [id number]  Will continue to follow while inpatient.  Thank you, Reche Dixon, MSN, RN Diabetes Coordinator Inpatient Diabetes Program [phone] (team pager from 8a-5p)

## 2021-09-12 DIAGNOSIS — N179 Acute kidney failure, unspecified: Secondary | ICD-10-CM

## 2021-09-12 DIAGNOSIS — E119 Type 2 diabetes mellitus without complications: Secondary | ICD-10-CM

## 2021-09-12 LAB — GLUCOSE, CAPILLARY
Glucose-Capillary: 177 mg/dL — ABNORMAL HIGH (ref 70–99)
Glucose-Capillary: 175 mg/dL — ABNORMAL HIGH (ref 70–99)
Glucose-Capillary: 131 mg/dL — ABNORMAL HIGH (ref 70–99)
Glucose-Capillary: 135 mg/dL — ABNORMAL HIGH (ref 70–99)
Glucose-Capillary: 165 mg/dL — ABNORMAL HIGH (ref 70–99)
Glucose-Capillary: 162 mg/dL — ABNORMAL HIGH (ref 70–99)
Glucose-Capillary: 173 mg/dL — ABNORMAL HIGH (ref 70–99)
Glucose-Capillary: 161 mg/dL — ABNORMAL HIGH (ref 70–99)
Glucose-Capillary: 147 mg/dL — ABNORMAL HIGH (ref 70–99)
Glucose-Capillary: 158 mg/dL — ABNORMAL HIGH (ref 70–99)
Glucose-Capillary: 196 mg/dL — ABNORMAL HIGH (ref 70–99)
Glucose-Capillary: 247 mg/dL — ABNORMAL HIGH (ref 70–99)
Glucose-Capillary: 253 mg/dL — ABNORMAL HIGH (ref 70–99)
Glucose-Capillary: 298 mg/dL — ABNORMAL HIGH (ref 70–99)

## 2021-09-12 LAB — BASIC METABOLIC PANEL
Anion gap: 7 (ref 5–15)
Anion gap: 7 (ref 5–15)
Anion gap: 8 (ref 5–15)
BUN: 6 mg/dL (ref 6–20)
BUN: 7 mg/dL (ref 6–20)
BUN: 8 mg/dL (ref 6–20)
CO2: 20 mmol/L — ABNORMAL LOW (ref 22–32)
CO2: 23 mmol/L (ref 22–32)
CO2: 24 mmol/L (ref 22–32)
Calcium: 7.9 mg/dL — ABNORMAL LOW (ref 8.9–10.3)
Calcium: 8.1 mg/dL — ABNORMAL LOW (ref 8.9–10.3)
Calcium: 8.2 mg/dL — ABNORMAL LOW (ref 8.9–10.3)
Chloride: 103 mmol/L (ref 98–111)
Chloride: 105 mmol/L (ref 98–111)
Chloride: 106 mmol/L (ref 98–111)
Creatinine, Ser: 0.93 mg/dL (ref 0.61–1.24)
Creatinine, Ser: 0.93 mg/dL (ref 0.61–1.24)
Creatinine, Ser: 0.97 mg/dL (ref 0.61–1.24)
GFR, Estimated: >60 mL/min (ref >60)
GFR, Estimated: >60 mL/min (ref >60)
GFR, Estimated: >60 mL/min (ref >60)
Glucose, Bld: 153 mg/dL — ABNORMAL HIGH (ref 70–99)
Glucose, Bld: 168 mg/dL — ABNORMAL HIGH (ref 70–99)
Glucose, Bld: 170 mg/dL — ABNORMAL HIGH (ref 70–99)
Potassium: 3 mmol/L — ABNORMAL LOW (ref 3.5–5.1)
Potassium: 3.1 mmol/L — ABNORMAL LOW (ref 3.5–5.1)
Potassium: 3.4 mmol/L — ABNORMAL LOW (ref 3.5–5.1)
Sodium: 131 mmol/L — ABNORMAL LOW (ref 135–145)
Sodium: 136 mmol/L (ref 135–145)
Sodium: 136 mmol/L (ref 135–145)

## 2021-09-12 LAB — BETA-HYDROXYBUTYRIC ACID
Beta-Hydroxybutyric Acid: 1.73 mmol/L — ABNORMAL HIGH (ref 0.05–0.27)
Beta-Hydroxybutyric Acid: 1.93 mmol/L — ABNORMAL HIGH (ref 0.05–0.27)

## 2021-09-12 LAB — HEMOGLOBIN A1C
Hgb A1c MFr Bld: 13.4 % — ABNORMAL HIGH (ref 4.8–5.6)
Mean Plasma Glucose: 338 mg/dL

## 2021-09-12 LAB — HIV ANTIBODY (ROUTINE TESTING W REFLEX): HIV Screen 4th Generation wRfx: NONREACTIVE

## 2021-09-12 MED ORDER — INSULIN GLARGINE-YFGN 100 UNIT/ML ~~LOC~~ SOLN
22.0000 [IU] | Freq: Every day | SUBCUTANEOUS | Status: DC
  Administered 2021-09-12 – 2021-09-13 (×2): 22 [IU] via SUBCUTANEOUS
  Filled 2021-09-12 (×2): qty 0.22

## 2021-09-12 MED ORDER — POTASSIUM CHLORIDE 20 MEQ PO PACK
40.0000 meq | PACK | Freq: Once | ORAL | Status: AC
  Administered 2021-09-12: 40 meq via ORAL
  Filled 2021-09-12: qty 2

## 2021-09-12 MED ORDER — SALINE SPRAY 0.65 % NA SOLN
1.0000 | NASAL | Status: DC | PRN
  Filled 2021-09-12: qty 44

## 2021-09-12 MED ORDER — INSULIN ASPART 100 UNIT/ML IJ SOLN
0.0000 [IU] | Freq: Three times a day (TID) | INTRAMUSCULAR | Status: DC
  Administered 2021-09-12: 11 [IU] via SUBCUTANEOUS
  Administered 2021-09-13: 15 [IU] via SUBCUTANEOUS
  Administered 2021-09-13: 11 [IU] via SUBCUTANEOUS

## 2021-09-12 MED ORDER — ALUM & MAG HYDROXIDE-SIMETH 200-200-20 MG/5ML PO SUSP
30.0000 mL | ORAL | Status: DC | PRN
  Administered 2021-09-12: 30 mL via ORAL
  Filled 2021-09-12: qty 30

## 2021-09-12 MED ORDER — INSULIN ASPART 100 UNIT/ML IJ SOLN
0.0000 [IU] | Freq: Every day | INTRAMUSCULAR | Status: DC
  Administered 2021-09-12: 3 [IU] via SUBCUTANEOUS

## 2021-09-12 NOTE — Assessment & Plan Note (Signed)
Continue statin. 

## 2021-09-12 NOTE — Plan of Care (Signed)
°  Problem: Education: Goal: Knowledge of General Education information will improve Description: Including pain rating scale, medication(s)/side effects and non-pharmacologic comfort measures Outcome: Progressing   Problem: Health Behavior/Discharge Planning: Goal: Ability to manage health-related needs will improve Outcome: Progressing   Problem: Clinical Measurements: Goal: Ability to maintain clinical measurements within normal limits will improve Outcome: Progressing Goal: Will remain free from infection Outcome: Progressing Goal: Diagnostic test results will improve Outcome: Progressing Goal: Respiratory complications will improve Outcome: Progressing Goal: Cardiovascular complication will be avoided Outcome: Progressing   Problem: Activity: Goal: Risk for activity intolerance will decrease Outcome: Progressing   Problem: Nutrition: Goal: Adequate nutrition will be maintained Outcome: Progressing   Problem: Coping: Goal: Level of anxiety will decrease Outcome: Progressing   Problem: Elimination: Goal: Will not experience complications related to bowel motility Outcome: Progressing Goal: Will not experience complications related to urinary retention Outcome: Progressing   Problem: Pain Managment: Goal: General experience of comfort will improve Outcome: Progressing   Problem: Safety: Goal: Ability to remain free from injury will improve Outcome: Progressing   Problem: Skin Integrity: Goal: Risk for impaired skin integrity will decrease Outcome: Progressing   Problem: Cardiac: Goal: Ability to maintain an adequate cardiac output will improve Outcome: Progressing   Problem: Health Behavior/Discharge Planning: Goal: Ability to identify and utilize available resources and services will improve Outcome: Progressing Goal: Ability to manage health-related needs will improve Outcome: Progressing   Problem: Fluid Volume: Goal: Ability to achieve a balanced  intake and output will improve Outcome: Progressing   Problem: Metabolic: Goal: Ability to maintain appropriate glucose levels will improve Outcome: Progressing   Problem: Nutritional: Goal: Maintenance of adequate nutrition will improve Outcome: Progressing Goal: Maintenance of adequate weight for body size and type will improve Outcome: Progressing   Problem: Respiratory: Goal: Will regain and/or maintain adequate ventilation Outcome: Progressing   Problem: Urinary Elimination: Goal: Ability to achieve and maintain adequate renal perfusion and functioning will improve Outcome: Progressing   Cindy S. Brigitte Pulse BSN, RN, [place] 09/12/2021 12:44 AM

## 2021-09-12 NOTE — Assessment & Plan Note (Signed)
Replace as needed °

## 2021-09-12 NOTE — Assessment & Plan Note (Signed)
Secondary to DKA.  Resolved with IV fluids.

## 2021-09-12 NOTE — Assessment & Plan Note (Addendum)
Wean off insulin drip.  Start Lantus.  New formal diagnosis for patient.  Diabetes education provided.  Patient will be discharged on metformin 500 XR daily plus Lantus 32 units subcu daily plus NovoLog resistant sliding scale.  Patient will have close outpatient follow-up and diabetes coordinator recommending if additional medication needed, consideration of a GLP-1 such as Trulicity or Ozempic.

## 2021-09-12 NOTE — Hospital Course (Addendum)
41 year old male with past medical history of ADD, hyperlipidemia, hypertension morbid obesity and previously prediabetic presented to the emergency room with polyuria and polydipsia and was found to have a blood sugar of 531 and in DKA.  Patient admitted to the hospitalist service and started on IV fluids and insulin drip.  By 1/28, DKA resolved and patient weaned off of insulin drip.  Started on Lantus.  A1c at 13.4.  Patient started on Lantus initially at 22 units daily.  Followed by diabetes coordinator and Lantus increased to 32 units with recommendations to add metformin and sliding scale.  By 1/29, patient received education and will plan to be discharged with close outpatient follow-up.

## 2021-09-12 NOTE — Progress Notes (Signed)
Inpatient Diabetes Program Recommendations  AACE/ADA: New Consensus Statement on Inpatient Glycemic Control (2015)  Target Ranges:  Prepandial:   less than 140 mg/dL      Peak postprandial:   less than 180 mg/dL (1-2 hours)      Critically ill patients:  140 - 180 mg/dL   Lab Results  Component Value Date   GLUCAP 196 (H) 09/12/2021   HGBA1C 13.4 (H) 09/11/2021    Review of Glycemic Control  Latest Reference Range & Units 09/12/21 11:16 09/12/21 12:37 09/12/21 13:09  Glucose-Capillary 70 - 99 mg/dL 147 (H) 158 (H) 196 (H)  (H): Data is abnormally high Diabetes history: New diagnosis of DM Outpatient Diabetes medications: None Current orders for Inpatient glycemic control:  Semglee 22 units daily Novolog resistant tid with meals and HS  Inpatient Diabetes Program Recommendations:    Spoke with patient briefly by phone.  He was seen by diabetes coordinator yesterday and education was begun.  Called to tell patient A1C results.  Per patient in the past, A1C was 6%.  Needs close fu with PCP and possibly endocrinologist.  He states he has not been on steroids but did have a recent oral implant?    Will likely need medication added for insulin resistance such as Metformin and also would likely benefit from GLP-1 such as Trulicity or Ozempic?  Thanks,  Adah Perl, RN, BC-ADM Inpatient Diabetes Coordinator Pager [phone]   (8a-5p)

## 2021-09-12 NOTE — Assessment & Plan Note (Signed)
DKA resolved on insulin drip and fluids.

## 2021-09-12 NOTE — Progress Notes (Signed)
[facility] Progress Note  Patient: Connor Dennis    QBH:[id number]  DOA: 09/10/2021    Date of Service: the patient was seen and examined on 09/12/2021  Brief hospital course: 41 year old male with past medical history of ADD, hyperlipidemia, hypertension morbid obesity and previously prediabetic presented to the emergency room with polyuria and polydipsia and was found to have a blood sugar of 531 and in DKA.  Patient admitted to the hospitalist service and started on IV fluids and insulin drip.  CBGs have since normalized.  Anion gap resolved.  Assessment and Plan: * DKA, type 2 (HCC)-resolved as of 09/12/2021, (present on admission) DKA resolved on insulin drip and fluids.  New onset type 2 diabetes mellitus (HCC) Wean off insulin drip.  Start Lantus.  New formal diagnosis for patient.  Will provide diabetes education.  AKI (acute kidney injury) (HCC)- (present on admission) Secondary to DKA.  Resolved with IV fluids.  Hyperlipidemia- (present on admission) Continue statin  Hypokalemia- (present on admission) Replace as needed.  Class 3 obesity (HCC)- (present on admission) Patient meets criteria for morbid obesity with BMI greater than 40    Body mass index is 45.48 kg/m.        Consultants: None  Procedures: None  Antimicrobials: None  Code Status: Full code   Subjective: Patient feeling better, tired, hungry.  Denies any abdominal pain  Objective: Vital signs were reviewed and unremarkable. Vitals:   09/12/21 1153 09/12/21 1200  BP: 110/61   Pulse: 93 88  Resp: 13 17  Temp:    SpO2: 99% 99%    Intake/Output Summary (Last 24 hours) at 09/12/2021 1239 Last data filed at 09/12/2021 1200 Gross per 24 hour  Intake 3219.16 ml  Output 1225 ml  Net 1994.16 ml   Filed Weights   09/10/21 2116 09/10/21 2143  Weight: (!) 158.5 kg (!) 143.8 kg   Body mass index is 45.48 kg/m.  Exam:  General: Alert and oriented x3, no acute distress HEENT:  Normocephalic and atraumatic, mucous membranes slightly dry Cardiovascular: Regular rate and rhythm, S1-S2 Respiratory: Clear to auscultation bilaterally Abdomen: Soft, nontender, nondistended, positive bowel sounds Musculoskeletal: No clubbing or cyanosis or edema Skin: No skin breaks, tears or lesions Psychiatry: Appropriate, no evidence of psychoses Neurology: No focal deficits  Data Reviewed: Labs from this morning note glucose of 168, potassium 3.1, normalized anion gap  Disposition:  Status is: Inpatient  Remains inpatient appropriate because: Ensuring that off of insulin drip, CBGs are stable and patient is tolerating new dosing of long-term acting insulin   Family Communication: Updated wife by phone DVT Prophylaxis:   Lovenox   Author: Hollice Espy ,MD 09/12/2021 12:39 PM  To reach On-call, see care teams to locate the attending and reach out via [email or web address]. Between 7PM-7AM, please contact night-coverage If you still have difficulty reaching the attending provider, please page the [facility] (Director on Call) for [facility] on amion for assistance.

## 2021-09-12 NOTE — Assessment & Plan Note (Signed)
Patient meets criteria for morbid obesity with BMI greater than 40. ?

## 2021-09-13 DIAGNOSIS — I10 Essential (primary) hypertension: Secondary | ICD-10-CM

## 2021-09-13 DIAGNOSIS — F988 Other specified behavioral and emotional disorders with onset usually occurring in childhood and adolescence: Secondary | ICD-10-CM

## 2021-09-13 LAB — GLUCOSE, CAPILLARY
Glucose-Capillary: 302 mg/dL — ABNORMAL HIGH (ref 70–99)
Glucose-Capillary: 273 mg/dL — ABNORMAL HIGH (ref 70–99)
Glucose-Capillary: 416 mg/dL — ABNORMAL HIGH (ref 70–99)

## 2021-09-13 MED ORDER — POTASSIUM CHLORIDE CRYS ER 20 MEQ PO TBCR
40.0000 meq | EXTENDED_RELEASE_TABLET | Freq: Two times a day (BID) | ORAL | Status: DC
  Administered 2021-09-13: 40 meq via ORAL
  Filled 2021-09-13: qty 2

## 2021-09-13 MED ORDER — BLOOD GLUCOSE MONITOR KIT: PACK | 0 refills | Status: AC

## 2021-09-13 MED ORDER — INSULIN GLARGINE-YFGN 100 UNIT/ML ~~LOC~~ SOLN
10.0000 [IU] | SUBCUTANEOUS | Status: AC
  Administered 2021-09-13: 10 [IU] via SUBCUTANEOUS
  Filled 2021-09-13: qty 0.1

## 2021-09-13 MED ORDER — INSULIN ASPART 100 UNIT/ML IJ SOLN
25.0000 [IU] | INTRAMUSCULAR | Status: AC
  Administered 2021-09-13: 25 [IU] via SUBCUTANEOUS

## 2021-09-13 MED ORDER — INSULIN LISPRO (1 UNIT DIAL) 100 UNIT/ML (KWIKPEN): PEN_INJECTOR | SUBCUTANEOUS | 11 refills | Status: DC

## 2021-09-13 MED ORDER — INSULIN PEN NEEDLE 32G X 6 MM MISC: 2 refills | Status: AC

## 2021-09-13 MED ORDER — METFORMIN HCL ER 500 MG PO TB24: 500.0000 mg | ORAL_TABLET | Freq: Every day | ORAL | 2 refills | Status: AC

## 2021-09-13 MED ORDER — INSULIN DETEMIR 100 UNIT/ML FLEXPEN: 32.0000 [IU] | PEN_INJECTOR | Freq: Every day | SUBCUTANEOUS | 11 refills | Status: DC

## 2021-09-13 NOTE — Assessment & Plan Note (Signed)
Stable, patient will continue home medications.

## 2021-09-13 NOTE — Progress Notes (Signed)
This RN provided discharge teaching to patient about insulin pen and reviewed discharge instructions with patient. All questions answered. All belongings returned to patient. PIVs removed. Patient transferred to main lobby via wheelchair by nurse tech. Per NT, patient safely ambulated to car.

## 2021-09-13 NOTE — Progress Notes (Signed)
Inpatient Diabetes Program Recommendations  AACE/ADA: New Consensus Statement on Inpatient Glycemic Control (2015)  Target Ranges:  Prepandial:   less than 140 mg/dL      Peak postprandial:   less than 180 mg/dL (1-2 hours)      Critically ill patients:  140 - 180 mg/dL   Lab Results  Component Value Date   GLUCAP 302 (H) 09/13/2021   HGBA1C 13.4 (H) 09/11/2021    Review of Glycemic Control  Latest Reference Range & Units 09/12/21 17:30 09/12/21 21:47 09/13/21 07:32  Glucose-Capillary 70 - 99 mg/dL 419 (H) 379 (H) 024 (H)  Diabetes history: New diagnosis of DM Outpatient Diabetes medications: None Current orders for Inpatient glycemic control:  Semglee 22 units daily Novolog resistant tid with meals and HS   Inpatient Diabetes Program Recommendations:    Please increase Semglee to 32 units daily. Also consider adding Novolog meal coverage 4 units tid with meals (hold if patient eats less than 50% or NPO).   Thanks,  Beryl Meager, RN, BC-ADM Inpatient Diabetes Coordinator Pager [phone]  (8a-5p)

## 2021-09-13 NOTE — Assessment & Plan Note (Signed)
Stable.  Patient not continued on Adderall during hospitalization, okay to resume on discharge.

## 2021-09-13 NOTE — Discharge Summary (Signed)
Physician Discharge Summary   Patient: Connor Dennis MRN: [id number] DOB: 1980-09-08  Admit date:     09/10/2021  Discharge date: 09/13/21  Discharge Physician: Annita Brod   [facility]: [facility]   Recommendations at discharge:   New medication: Lantus 32 units subcu daily New medication: Metformin 500 XR p.o. daily Patient will follow-up with his PCP in the next 1 to 2 weeks New medication: NovoLog sliding scale 3 times daily with meals CBG 151-200: 4 units CBG 201-250: 7 units CBG 251-300: 11 units CBG 301-350: 15 units CBG 351-400: 20 units  Discharge Diagnoses Active Problems:   New onset type 2 diabetes mellitus ([place])   AKI (acute kidney injury) ([place])   Essential hypertension   Hyperlipidemia   Hypokalemia   Class 3 obesity (HCC)   Microcytic anemia   ADD (attention deficit disorder)  Principal Problem (Resolved):   DKA, type 2 [facility])   Hospital Course   41 year old male with past medical history of ADD, hyperlipidemia, hypertension morbid obesity and previously prediabetic presented to the emergency room with polyuria and polydipsia and was found to have a blood sugar of 531 and in DKA.  Patient admitted to the hospitalist service and started on IV fluids and insulin drip.  By 1/28, DKA resolved and patient weaned off of insulin drip.  Started on Lantus.  A1c at 13.4.  Patient started on Lantus initially at 22 units daily.  Followed by diabetes coordinator and Lantus increased to 32 units with recommendations to add metformin and sliding scale.  By 1/29, patient received education and will plan to be discharged with close outpatient follow-up.  * DKA, type 2 (HCC)-resolved as of 09/12/2021, (present on admission) DKA resolved on insulin drip and fluids.  New onset type 2 diabetes mellitus ([place]) Wean off insulin drip.  Start Lantus.  New formal diagnosis for patient.  Diabetes education provided.  Patient will be discharged on metformin 500 XR daily  plus Lantus 32 units subcu daily plus NovoLog resistant sliding scale.  Patient will have close outpatient follow-up and diabetes coordinator recommending if additional medication needed, consideration of a GLP-1 such as Trulicity or Ozempic.  AKI (acute kidney injury) ([place])- (present on admission) Secondary to DKA.  Resolved with IV fluids.  Essential hypertension- (present on admission) Stable, patient will continue home medications.  Hyperlipidemia- (present on admission) Continue statin  Hypokalemia- (present on admission) Replace as needed.  Class 3 obesity ([place])- (present on admission) Patient meets criteria for morbid obesity with BMI greater than 40  ADD (attention deficit disorder) Stable.  Patient not continued on Adderall during hospitalization, okay to resume on discharge.        Consultants: Diabetes coordinator Procedures performed: None Disposition: Home Diet recommendation: Carb modified diet, low-sodium  DISCHARGE MEDICATION: Allergies as of 09/13/2021   No Known Allergies      Medication List     TAKE these medications    amLODipine 10 MG tablet Commonly known as: NORVASC Take 10 mg by mouth every morning.   amphetamine-dextroamphetamine 30 MG tablet Commonly known as: ADDERALL Take 30 mg by mouth daily as needed (to focus at work).   blood glucose meter kit and supplies Kit Dispense based on patient and insurance preference. Use up to four times daily as directed.   insulin detemir 100 UNIT/ML FlexPen Commonly known as: LEVEMIR Inject 32 Units into the skin daily.   insulin lispro 100 UNIT/ML KwikPen Commonly known as: HUMALOG Check blood sugars 3 times a day  before meals. CBG 151-200: 4 units CBG 201-250: 7 units CBG 251-300: 11 units CBG 301-350: 15 units CBG 351-400: 20 units   Insulin Pen Needle 32G X 6 MM Misc Use as directed   magnesium hydroxide 400 MG/5ML suspension Commonly known as: MILK OF MAGNESIA Take 30 mLs by mouth  daily as needed (constipation).   metFORMIN 500 MG 24 hr tablet Commonly known as: GLUCOPHAGE-XR Take 1 tablet (500 mg total) by mouth daily with breakfast.   rosuvastatin 20 MG tablet Commonly known as: CRESTOR Take 20 mg by mouth every morning.   sodium phosphate 7-19 GM/118ML Enem Place 1 enema rectally once as needed for severe constipation.   Vitamin D (Ergocalciferol) 1.25 MG (50000 UNIT) Caps capsule Commonly known as: DRISDOL Take 50,000 Units by mouth every Wednesday.         Follow-up Information     [facility] Follow up in 1 week(s).   Specialty: Rheumatology Contact information: [place] [address] [phone]                 Discharge Exam: Danley Danker Weights   09/10/21 2116 09/10/21 2143  Weight: (!) 158.5 kg (!) 143.8 kg   General: Alert and oriented x3, no acute distress Cardiovascular: Regular rate and rhythm, S1-S2 Lungs: Clear to auscultation bilaterally  Condition at discharge: good  The results of significant diagnostics from this hospitalization (including imaging, microbiology, ancillary and laboratory) are listed below for reference.   Imaging Studies: No results found.  Microbiology: Results for orders placed or performed during the hospital encounter of 09/10/21  Resp Panel by RT-PCR (Flu A&B, Covid) Nasopharyngeal Swab     Status: None   Collection Time: 09/10/21  9:53 PM   Specimen: Nasopharyngeal Swab; Nasopharyngeal(NP) swabs in vial transport medium  Result Value Ref Range Status   SARS Coronavirus 2 by RT PCR NEGATIVE NEGATIVE Final    Comment: (NOTE) SARS-CoV-2 target nucleic acids are NOT DETECTED.  The SARS-CoV-2 RNA is generally detectable in upper respiratory specimens during the acute phase of infection. The lowest concentration of SARS-CoV-2 viral copies this assay can detect is 138 copies/mL. A negative result does not preclude SARS-Cov-2 infection and should not be used  as the sole basis for treatment or other patient management decisions. A negative result may occur with  improper specimen collection/handling, submission of specimen other than nasopharyngeal swab, presence of viral mutation(s) within the areas targeted by this assay, and inadequate number of viral copies(<138 copies/mL). A negative result must be combined with clinical observations, patient history, and epidemiological information. The expected result is Negative.  Fact Sheet for Patients:  EntrepreneurPulse.com.au  Fact Sheet for Healthcare Providers:  IncredibleEmployment.be  This test is no t yet approved or cleared by the [place] FDA and  has been authorized for detection and/or diagnosis of SARS-CoV-2 by FDA under an Emergency Use Authorization (EUA). This EUA will remain  in effect (meaning this test can be used) for the duration of the COVID-19 declaration under Section 564(b)(1) of the Act, 21 U.S.C.section 360bbb-3(b)(1), unless the authorization is terminated  or revoked sooner.       Influenza A by PCR NEGATIVE NEGATIVE Final   Influenza B by PCR NEGATIVE NEGATIVE Final    Comment: (NOTE) The Xpert Xpress SARS-CoV-2/FLU/RSV plus assay is intended as an aid in the diagnosis of influenza from Nasopharyngeal swab specimens and should not be used as a sole basis for treatment. Nasal washings and aspirates are unacceptable for Xpert  Xpress SARS-CoV-2/FLU/RSV testing.  Fact Sheet for Patients: EntrepreneurPulse.com.au  Fact Sheet for Healthcare Providers: IncredibleEmployment.be  This test is not yet approved or cleared by the [place] FDA and has been authorized for detection and/or diagnosis of SARS-CoV-2 by FDA under an Emergency Use Authorization (EUA). This EUA will remain in effect (meaning this test can be used) for the duration of the COVID-19 declaration under Section 564(b)(1)  of the Act, 21 U.S.C. section 360bbb-3(b)(1), unless the authorization is terminated or revoked.  Performed at [facility], [address] [place], [place] [address]   MRSA Next Gen by PCR, Nasal     Status: None   Collection Time: 09/11/21  1:04 AM   Specimen: Nasal Mucosa; Nasal Swab  Result Value Ref Range Status   MRSA by PCR Next Gen NOT DETECTED NOT DETECTED Final    Comment: (NOTE) The GeneXpert MRSA Assay (FDA approved for NASAL specimens only), is one component of a comprehensive MRSA colonization surveillance program. It is not intended to diagnose MRSA infection nor to guide or monitor treatment for MRSA infections. Test performance is not FDA approved in patients less than 33 years old. Performed at [facility], [place] [address] [place], [place] [address]     Labs: CBC: Recent Labs  Lab 09/10/21 2125 09/10/21 2239 09/11/21 0800  WBC 11.9*  --  10.5  NEUTROABS 7.8*  --   --   HGB 14.0 13.3 12.4*  HCT 41.4 39.0 36.3*  MCV 74.7*  --  74.8*  PLT 302  --  021   Basic Metabolic Panel: Recent Labs  Lab 09/11/21 0800 09/11/21 1108 09/11/21 1557 09/11/21 1933 09/11/21 2341 09/12/21 0416 09/12/21 0807  NA 136   < > 133* 132* 131* 136 136  K 3.4*   < > 3.1* 2.9* 3.4* 3.0* 3.1*  CL 106   < > 105 103 103 106 105  CO2 18*   < > 20* 21* 20* 23 24  GLUCOSE 154*   < > 159* 151* 170* 153* 168*  BUN 15   < > 11 10 8 7 6   CREATININE 1.10   < > 1.02 0.93 0.93 0.93 0.97  CALCIUM 8.4*   < > 8.0* 8.0* 7.9* 8.2* 8.1*  MG 2.3  --   --   --   --   --   --   PHOS 3.3  --   --   --   --   --   --    < > = values in this interval not displayed.   Liver Function Tests: Recent Labs  Lab 09/10/21 2125  AST 15  ALT 22  ALKPHOS 111  BILITOT 0.6  PROT 8.3*  ALBUMIN 4.6   CBG: Recent Labs  Lab 09/12/21 1602 09/12/21 1730 09/12/21 2147 09/13/21 0732 09/13/21 1151  GLUCAP 247* 253* 298* 302* 273*    Discharge time spent:  greater than 30 minutes.  Signed: Annita Brod, MD [facility] 09/13/2021

## 2021-10-26 ENCOUNTER — Encounter: Payer: Self-pay | Admitting: Podiatry

## 2021-10-26 ENCOUNTER — Other Ambulatory Visit: Payer: Self-pay

## 2021-10-26 ENCOUNTER — Ambulatory Visit (INDEPENDENT_AMBULATORY_CARE_PROVIDER_SITE_OTHER): Payer: No Typology Code available for payment source | Admitting: Podiatry

## 2021-10-26 DIAGNOSIS — E7800 Pure hypercholesterolemia, unspecified: Secondary | ICD-10-CM | POA: Insufficient documentation

## 2021-10-26 DIAGNOSIS — E559 Vitamin D deficiency, unspecified: Secondary | ICD-10-CM | POA: Insufficient documentation

## 2021-10-26 DIAGNOSIS — K5900 Constipation, unspecified: Secondary | ICD-10-CM | POA: Insufficient documentation

## 2021-10-26 DIAGNOSIS — M79674 Pain in right toe(s): Secondary | ICD-10-CM

## 2021-10-26 DIAGNOSIS — E1165 Type 2 diabetes mellitus with hyperglycemia: Secondary | ICD-10-CM | POA: Insufficient documentation

## 2021-10-26 DIAGNOSIS — N3281 Overactive bladder: Secondary | ICD-10-CM | POA: Insufficient documentation

## 2021-10-26 DIAGNOSIS — M79675 Pain in left toe(s): Secondary | ICD-10-CM

## 2021-10-26 DIAGNOSIS — R200 Anesthesia of skin: Secondary | ICD-10-CM | POA: Insufficient documentation

## 2021-10-26 DIAGNOSIS — B351 Tinea unguium: Secondary | ICD-10-CM

## 2021-10-26 DIAGNOSIS — E119 Type 2 diabetes mellitus without complications: Secondary | ICD-10-CM

## 2021-10-26 NOTE — Progress Notes (Signed)
This patient presents to the office with chief complaint of long thick nails and diabetic feet.  This patient  says there  is  pain and discomfort in his feet due to plantar fasciitis of his  feet.  This patient says there are long thick painful  big toenails.  These nails are painful walking and wearing shoes.  Patient has no history of infection or drainage from both feet.  Patient is unable to  self treat his own nails . This patient presents  to the office today for treatment of the  long nails and a foot evaluation due to history of  diabetes. ? ?General Appearance  Alert, conversant and in no acute stress. ? ?Vascular  Dorsalis pedis and posterior tibial  pulses are palpable  right.  Dorsalis pedis and posterior tibial pulses are weakly palpable left foot..  Capillary return is within normal limits  bilaterally. Temperature is within normal limits  bilaterally. ? ?Neurologic  Senn-Weinstein monofilament wire test within normal limits  bilaterally. Muscle power within normal limits bilaterally. ? ?Nails Thick disfigured discolored nails with subungual debris  hallux nails bilaterally. No evidence of bacterial infection or drainage bilaterally. ? ?Orthopedic  No limitations of motion of motion feet .  No crepitus or effusions noted.  No bony pathology or digital deformities noted. ? ?Skin  normotropic skin with no porokeratosis noted bilaterally.  No signs of infections or ulcers noted.    ? ?Onychomycosis  Diabetes with no foot complications ? ?IE  Debride nails x 10. With nail nipper and dremel tool.  A diabetic foot exam was performed and there is no evidence of any vascular or neurologic pathology.   RTC 1 year for diabetic exam. ? ? ?Helane Gunther DPM   ?

## 2021-10-27 ENCOUNTER — Ambulatory Visit: Payer: No Typology Code available for payment source

## 2021-10-27 DIAGNOSIS — E119 Type 2 diabetes mellitus without complications: Secondary | ICD-10-CM

## 2021-10-27 NOTE — Progress Notes (Signed)
Patient seen for evaluation for foot orthotics. Discussed financial responsibility and patient would like to have a precertification performed so that he can have an out of pocket estimated cost. Precertification to be performed and patient to be made aware of out of pocket cost so he can make an informed decision. ?

## 2023-06-09 ENCOUNTER — Ambulatory Visit: Payer: BLUE CROSS/BLUE SHIELD | Admitting: Dermatology

## 2023-06-09 ENCOUNTER — Encounter: Payer: Self-pay | Admitting: Dermatology

## 2023-06-09 VITALS — BP 142/90 | HR 90

## 2023-06-09 DIAGNOSIS — L730 Acne keloid: Secondary | ICD-10-CM

## 2023-06-09 NOTE — Patient Instructions (Addendum)
Hello Connor Dennis,  Thank you for visiting my office today. Your proactive approach towards addressing your skin concerns is highly commendable. Below is a summary of the essential instructions from our consultation to assist you in managing your condition effectively:  - Tretinoin Cream:   - Application: Use a pea-sized amount mixed with lotion every other night. This regimen is aimed at skin remodeling and enhancing cell turnover, which is crucial for preventing and flattening bumps.  - Moisturizer Recommendation:   - Product: Utilize CeraVe to counteract the dryness associated with Tretinoin use.   - Note: It's important to avoid heavy creams such as cocoa butter or shea butter.  - Benzoyl Peroxide Wash:   - Usage: Incorporate CeraVe containing 4% benzoyl peroxide into your routine every other day. This will aid in killing bacteria and managing your skin condition more effectively.  - Follow-Up Appointments:   - Schedule: We will assess your progress every three months to make any necessary adjustments to your treatment plan.  - Samples Provided:   - Details: Today, you received samples of both Tretinoin and CeraVe with benzoyl peroxide.  Please ensure to follow these treatments as directed. Should you have any questions or concerns, do not hesitate to reach out. I look forward to our next appointment and witnessing the improvements in your skin health.  Warm regards,  Dr. [facility]  Important Information   Due to recent changes in healthcare laws, you may see results of your pathology and/or laboratory studies on MyChart before the doctors have had a chance to review them. We understand that in some cases there may be results that are confusing or concerning to you. Please understand that not all results are received at the same time and often the doctors may need to interpret multiple results in order to provide you with the best plan of care or course of treatment.  Therefore, we ask that you please give [place] 2 business days to thoroughly review all your results before contacting the office for clarification. Should we see a critical lab result, you will be contacted sooner.     If You Need Anything After Your Visit   If you have any questions or concerns for your doctor, please call our main line at [phone]. If no one answers, please leave a voicemail as directed and we will return your call as soon as possible. Messages left after 4 pm will be answered the following business day.    You may also send [place] a message via MyChart. We typically respond to MyChart messages within 1-2 business days.  For prescription refills, please ask your pharmacy to contact our office. Our fax number is [phone].  If you have an urgent issue when the clinic is closed that cannot wait until the next business day, you can page your doctor at the number below.     Please note that while we do our best to be available for urgent issues outside of office hours, we are not available 24/7.    If you have an urgent issue and are unable to reach [place], you may choose to seek medical care at your doctor's office, retail clinic, urgent care center, or emergency room.   If you have a medical emergency, please immediately call 911 or go to the emergency department. In the event of inclement weather, please call our main line at [phone] for an update on the status of any delays or closures.  Dermatology Medication Tips: Please keep the  boxes that topical medications come in in order to help keep track of the instructions about where and how to use these. Pharmacies typically print the medication instructions only on the boxes and not directly on the medication tubes.   If your medication is too expensive, please contact our office at [phone] or send [place] a message through MyChart.    We are unable to tell what your co-pay for medications will be in advance as this is  different depending on your insurance coverage. However, we may be able to find a substitute medication at lower cost or fill out paperwork to get insurance to cover a needed medication.    If a prior authorization is required to get your medication covered by your insurance company, please allow [place] 1-2 business days to complete this process.   Drug prices often vary depending on where the prescription is filled and some pharmacies may offer cheaper prices.   The website www.goodrx.com contains coupons for medications through different pharmacies. The prices here do not account for what the cost may be with help from insurance (it may be cheaper with your insurance), but the website can give you the price if you did not use any insurance.  - You can print the associated coupon and take it with your prescription to the pharmacy.  - You may also stop by our office during regular business hours and pick up a GoodRx coupon card.  - If you need your prescription sent electronically to a different pharmacy, notify our office through [facility] or by phone at [phone]

## 2023-06-09 NOTE — Progress Notes (Signed)
   New Patient Visit   Subjective  Connor Dennis is a 42 y.o. male who presents for the following: Folliculitis keloidalis nuchae (AKN)  Patient states he  has bumps located at the scalp that he  would like to have examined. Patient reports the areas have been there for Several years. He reports the areas are bothersome. Patient rates irritation 2 out of 10. He states that the areas have not spread. Patient reports he has previously been treated for these areas when he lived in. He was prescribed Clobetasol that has helped tremendously with his sxs. He last used medication yesterday. Patient denies Hx of bx.   The following portions of the chart were reviewed this encounter and updated as appropriate: medications, allergies, medical history  Review of Systems:  No other skin or systemic complaints except as noted in HPI or Assessment and Plan.  Objective  Well appearing patient in no apparent distress; mood and affect are within normal limits.  A focused examination was performed of the following areas: Scalp  Relevant exam findings are noted in the Assessment and Plan.       Assessment & Plan   Folliculitis keloidalis nuchae (AKN) Exam: Perifollicular erythematous papules and pustules located at posterior scalp  Folliculitis keloidalis nuchae (AKN) occurs due to inflammation of the superficial hair follicle (pore), resulting in acne-like lesions (pus bumps). It can be infectious (bacterial, fungal) or noninfectious (shaving, tight clothing, heat/sweat, medications).  Folliculitis can be acute or chronic and recommended treatment depends on the underlying cause of folliculitis.  Treatment Plan: - Advised to continue the Clobetasol prn but not on a consistent basis - We will prescribe Tretinoin 0.025% to apply on a every other night mixed with lotion - Recommended using Cerave Lotion to mix with Tretinoin - Recommended washing the area with Cerave 4% BPO to wash with to prevent  additional flares - We will plan to follow up in 3 Months   Return in about 3 months (around 09/09/2023).    Documentation: I have reviewed the above documentation for accuracy and completeness, and I agree with the above.  Stasia Cavalier, am acting as scribe for Langston Reusing, DO.  Langston Reusing, DO

## 2023-07-06 ENCOUNTER — Other Ambulatory Visit: Payer: Self-pay

## 2023-07-06 MED ORDER — TRETINOIN 0.025 % EX CREA: TOPICAL_CREAM | Freq: Every day | CUTANEOUS | 2 refills | Status: AC

## 2023-09-13 ENCOUNTER — Ambulatory Visit: Payer: No Typology Code available for payment source | Admitting: Dermatology

## 2024-08-13 ENCOUNTER — Other Ambulatory Visit: Payer: Self-pay

## 2024-08-13 ENCOUNTER — Emergency Department (HOSPITAL_BASED_OUTPATIENT_CLINIC_OR_DEPARTMENT_OTHER)
Admission: EM | Admit: 2024-08-13 | Discharge: 2024-08-14 | Disposition: A | Discharge status: Home / Self Care | Attending: Emergency Medicine | Admitting: Emergency Medicine

## 2024-08-13 DIAGNOSIS — T383X5A Adverse effect of insulin and oral hypoglycemic [antidiabetic] drugs, initial encounter: Secondary | ICD-10-CM | POA: Diagnosis not present

## 2024-08-13 DIAGNOSIS — K219 Gastro-esophageal reflux disease without esophagitis: Secondary | ICD-10-CM | POA: Insufficient documentation

## 2024-08-13 DIAGNOSIS — R112 Nausea with vomiting, unspecified: Secondary | ICD-10-CM | POA: Insufficient documentation

## 2024-08-13 DIAGNOSIS — T887XXA Unspecified adverse effect of drug or medicament, initial encounter: Secondary | ICD-10-CM

## 2024-08-13 DIAGNOSIS — E1165 Type 2 diabetes mellitus with hyperglycemia: Secondary | ICD-10-CM | POA: Insufficient documentation

## 2024-08-13 DIAGNOSIS — Z794 Long term (current) use of insulin: Secondary | ICD-10-CM | POA: Diagnosis not present

## 2024-08-13 DIAGNOSIS — Z79899 Other long term (current) drug therapy: Secondary | ICD-10-CM | POA: Insufficient documentation

## 2024-08-13 LAB — RESP PANEL BY RT-PCR (RSV, FLU A&B, COVID)  RVPGX2
Influenza A by PCR: NEGATIVE
Influenza B by PCR: NEGATIVE
Resp Syncytial Virus by PCR: NEGATIVE
SARS Coronavirus 2 by RT PCR: NEGATIVE

## 2024-08-13 LAB — CBC
HCT: 41.3 % (ref 39.0–52.0)
Hemoglobin: 14.7 g/dL (ref 13.0–17.0)
MCH: 26 pg (ref 26.0–34.0)
MCHC: 35.6 g/dL (ref 30.0–36.0)
MCV: 73.1 fL — ABNORMAL LOW (ref 80.0–100.0)
Platelets: 269 K/uL (ref 150–400)
RBC: 5.65 MIL/uL (ref 4.22–5.81)
RDW: 13.2 % (ref 11.5–15.5)
WBC: 10.4 K/uL (ref 4.0–10.5)
nRBC: 0 % (ref 0.0–0.2)

## 2024-08-13 LAB — HEPATIC FUNCTION PANEL
ALT: 39 U/L (ref 0–44)
AST: 24 U/L (ref 15–41)
Albumin: 4.5 g/dL (ref 3.5–5.0)
Alkaline Phosphatase: 116 U/L (ref 38–126)
Bilirubin, Direct: 0.3 mg/dL — ABNORMAL HIGH (ref 0.0–0.2)
Indirect Bilirubin: 0.6 mg/dL (ref 0.3–0.9)
Total Bilirubin: 0.9 mg/dL (ref 0.0–1.2)
Total Protein: 8.1 g/dL (ref 6.5–8.1)

## 2024-08-13 LAB — BASIC METABOLIC PANEL WITH GFR
Anion gap: 18 — ABNORMAL HIGH (ref 5–15)
BUN: 18 mg/dL (ref 6–20)
CO2: 22 mmol/L (ref 22–32)
Calcium: 10 mg/dL (ref 8.9–10.3)
Chloride: 91 mmol/L — ABNORMAL LOW (ref 98–111)
Creatinine, Ser: 1.33 mg/dL — ABNORMAL HIGH (ref 0.61–1.24)
GFR, Estimated: >60 mL/min
Glucose, Bld: 385 mg/dL — ABNORMAL HIGH (ref 70–99)
Potassium: 4.6 mmol/L (ref 3.5–5.1)
Sodium: 132 mmol/L — ABNORMAL LOW (ref 135–145)

## 2024-08-13 LAB — URINALYSIS, ROUTINE W REFLEX MICROSCOPIC
Glucose, UA: >=1000 mg/dL — AB
Hgb urine dipstick: NEGATIVE
Ketones, ur: >=80 mg/dL — AB
Leukocytes,Ua: NEGATIVE
Nitrite: NEGATIVE
Protein, ur: 30 mg/dL — AB
Specific Gravity, Urine: 1.025 (ref 1.005–1.030)
pH: 5.5 (ref 5.0–8.0)

## 2024-08-13 LAB — CBG MONITORING, ED
Glucose-Capillary: 372 mg/dL — ABNORMAL HIGH (ref 70–99)
Glucose-Capillary: 372 mg/dL — ABNORMAL HIGH (ref 70–99)

## 2024-08-13 LAB — URINALYSIS, MICROSCOPIC (REFLEX): Bacteria, UA: NONE SEEN

## 2024-08-13 MED ORDER — LACTATED RINGERS IV BOLUS
2000.0000 mL | Freq: Once | INTRAVENOUS | Status: AC
  Administered 2024-08-13: 2000 mL via INTRAVENOUS

## 2024-08-13 MED ORDER — ONDANSETRON HCL 4 MG/2ML IJ SOLN
4.0000 mg | Freq: Once | INTRAMUSCULAR | Status: AC
  Administered 2024-08-13: 4 mg via INTRAVENOUS

## 2024-08-13 MED ORDER — INSULIN ASPART 100 UNIT/ML IJ SOLN
10.0000 [IU] | Freq: Once | INTRAMUSCULAR | Status: AC
  Administered 2024-08-13: 10 [IU] via SUBCUTANEOUS
  Filled 2024-08-13: qty 10

## 2024-08-13 MED ORDER — ONDANSETRON 4 MG PO TBDP: 4.0000 mg | ORAL_TABLET | Freq: Once | ORAL | Status: DC

## 2024-08-13 NOTE — ED Provider Notes (Signed)
 " [place] EM[facility]GENC[facility] DEPARTMENT AT [facility] Provider Note   CSN: [id number] Arrival date & time: 08/13/24  1733     Patient presents with: Hyperglycemia   Connor Dennis is a 43 y.o. male.  {Add pertinent medical, surgical, social history, OB history to HPI:32947}  Hyperglycemia      Prior to Admission medications  Medication Sig Start Date End Date Taking? Authorizing Provider  amLODipine (NORVASC) 10 MG tablet Take 10 mg by mouth every morning. 08/22/20   [provider]  amphetamine-dextroamphetamine (ADDERALL) 30 MG tablet Take 30 mg by mouth daily as needed (to focus at work). 07/30/20   [provider]  blood glucose meter kit and supplies KIT Dispense based on patient and insurance preference. Use up to four times daily as directed. 09/13/21   Krishnan, Sendil K, MD  clobetasol cream (TEMOVATE) 0.05 % Apply 1 Application topically 2 (two) times daily as needed. 04/11/23   [provider]  insulin  detemir (LEVEMIR ) 100 UNIT/ML FlexPen Inject 32 Units into the skin daily. Patient not taking: Reported on 06/09/2023 09/13/21   Krishnan, Sendil K, MD  insulin  lispro (HUMALOG ) 100 UNIT/ML KwikPen Check blood sugars 3 times a day before meals. CBG 151-200: 4 units CBG 201-250: 7 units CBG 251-300: 11 units CBG 301-350: 15 units CBG 351-400: 20 units Patient not taking: Reported on 06/09/2023 09/13/21   Krishnan, Sendil K, MD  Insulin  Pen Needle 32G X 6 MM MISC Use as directed 09/13/21   Krishnan, Sendil K, MD  lisinopril (ZESTRIL) 5 MG tablet Take 5 mg by mouth daily. 04/15/23   [provider]  magnesium hydroxide (MILK OF MAGNESIA) 400 MG/5ML suspension Take 30 mLs by mouth daily as needed (constipation).    [provider]  metFORMIN  (GLUCOPHAGE -XR) 500 MG 24 hr tablet Take 1 tablet (500 mg total) by mouth daily with breakfast. 09/13/21   Krishnan, Sendil K, MD  rosuvastatin (CRESTOR) 20 MG tablet Take 20 mg by mouth every  morning. 07/07/21   [provider]  Semaglutide,0.25 or 0.5MG /DOS, (OZEMPIC, 0.25 OR 0.5 MG/DOSE,) 2 MG/1.5ML SOPN 0.25-0.5 mg 10/13/21   [provider]  sodium phosphate (FLEET) 7-19 GM/118ML ENEM Place 1 enema rectally once as needed for severe constipation.    [provider]  Vitamin D, Ergocalciferol, (DRISDOL) 1.25 MG (50000 UNIT) CAPS capsule Take 50,000 Units by mouth every Wednesday. 08/06/21   [provider]    Allergies: Patient has no known allergies.    Review of Systems  Updated Vital Signs BP 107/81 (BP Location: Right Arm)   Pulse (!) 105   Temp 97.7 F (36.5 C)   Resp 18   SpO2 96%   Physical Exam  (all labs ordered are listed, but only abnormal results are displayed) Labs Reviewed  BASIC METABOLIC PANEL WITH GFR - Abnormal; Notable for the following components:      Result Value   Sodium 132 (*)    Chloride 91 (*)    Glucose, Bld 385 (*)    Creatinine, Ser 1.33 (*)    Anion gap 18 (*)    All other components within normal limits  CBC - Abnormal; Notable for the following components:   MCV 73.1 (*)    All other components within normal limits  URINALYSIS, ROUTINE W REFLEX MICROSCOPIC - Abnormal; Notable for the following components:   Glucose, UA >=1000 (*)    Bilirubin Urine MODERATE (*)    Ketones, ur >=80 (*)    Protein,  ur 30 (*)    All other components within normal limits  HEPATIC FUNCTION PANEL - Abnormal; Notable for the following components:   Bilirubin, Direct 0.3 (*)    All other components within normal limits  CBG MONITORING, ED - Abnormal; Notable for the following components:   Glucose-Capillary 372 (*)    All other components within normal limits  CBG MONITORING, ED - Abnormal; Notable for the following components:   Glucose-Capillary 372 (*)    All other components within normal limits  RESP PANEL BY RT-PCR (RSV, FLU A&B, COVID)  RVPGX2  URINALYSIS, MICROSCOPIC (REFLEX)     EKG: None  Radiology: No results found.  {Document cardiac monitor, telemetry assessment procedure when appropriate:32947} Procedures   Medications Ordered in the ED  ondansetron  (ZOFRAN ) injection 4 mg (4 mg Intravenous Given 08/13/24 1843)      {Click here for ABCD2, HEART and other calculators REFRESH Note before signing:1}                              Medical Decision Making Amount and/or Complexity of Data Reviewed Labs: ordered.  Risk Prescription drug management.   ***  {Document critical care time when appropriate  Document review of labs and clinical decision tools ie CHADS2VASC2, etc  Document your independent review of radiology images and any outside records  Document your discussion with family members, caretakers and with consultants  Document social determinants of health affecting pt's care  Document your decision making why or why not admission, treatments were needed:32947:::1}   Final diagnoses:  None    ED Discharge Orders     None        "

## 2024-08-13 NOTE — ED Triage Notes (Addendum)
 CBG 350 at home. Metformin , Ozempic several months irregularly-last dose today. No recent illness. +N/+V/-D.

## 2024-08-13 NOTE — ED Provider Triage Note (Signed)
 Emergency Medicine Provider Triage Evaluation Note  Connor Dennis , a 43 y.o. male  was evaluated in triage.  Pt complains of nausea, vomiting. No diarrhea. No fever, abdominal pain, chest pain, SOB, cough, rhinorrhea, congestion.   Review of Systems  Positive:  Negative:   Physical Exam  BP 107/81 (BP Location: Right Arm)   Pulse (!) 105   Temp 97.7 F (36.5 C)   Resp 18   SpO2 96%  Gen:   Awake, no distress   Resp:  Normal effort  MSK:   Moves extremities without difficulty  Other:    Medical Decision Making  Medically screening exam initiated at 6:38 PM.  Appropriate orders placed.  TYMEER VAQUERA was informed that the remainder of the evaluation will be completed by another provider, this initial triage assessment does not replace that evaluation, and the importance of remaining in the ED until their evaluation is complete.     Hoy Nidia FALCON, [place] 08/13/24 [phone]

## 2024-08-14 ENCOUNTER — Emergency Department (HOSPITAL_BASED_OUTPATIENT_CLINIC_OR_DEPARTMENT_OTHER)

## 2024-08-14 LAB — BASIC METABOLIC PANEL WITH GFR
Anion gap: 16 — ABNORMAL HIGH (ref 5–15)
BUN: 19 mg/dL (ref 6–20)
CO2: 22 mmol/L (ref 22–32)
Calcium: 9.2 mg/dL (ref 8.9–10.3)
Chloride: 95 mmol/L — ABNORMAL LOW (ref 98–111)
Creatinine, Ser: 1.23 mg/dL (ref 0.61–1.24)
GFR, Estimated: >60 mL/min
Glucose, Bld: 344 mg/dL — ABNORMAL HIGH (ref 70–99)
Potassium: 4.7 mmol/L (ref 3.5–5.1)
Sodium: 133 mmol/L — ABNORMAL LOW (ref 135–145)

## 2024-08-14 LAB — I-STAT VENOUS BLOOD GAS, ED
Acid-Base Excess: 2 mmol/L (ref 0.0–2.0)
Bicarbonate: 28.3 mmol/L — ABNORMAL HIGH (ref 20.0–28.0)
Calcium, Ion: 1.22 mmol/L (ref 1.15–1.40)
HCT: 39 % (ref 39.0–52.0)
Hemoglobin: 13.3 g/dL (ref 13.0–17.0)
O2 Saturation: 56 %
Patient temperature: 98.6
Potassium: 3.9 mmol/L (ref 3.5–5.1)
Sodium: 133 mmol/L — ABNORMAL LOW (ref 135–145)
TCO2: 30 mmol/L (ref 22–32)
pCO2, Ven: 51.4 mmHg (ref 44–60)
pH, Ven: 7.35 (ref 7.25–7.43)
pO2, Ven: 32 mmHg (ref 32–45)

## 2024-08-14 LAB — CBG MONITORING, ED
Glucose-Capillary: 267 mg/dL — ABNORMAL HIGH (ref 70–99)
Glucose-Capillary: 284 mg/dL — ABNORMAL HIGH (ref 70–99)
Glucose-Capillary: 371 mg/dL — ABNORMAL HIGH (ref 70–99)

## 2024-08-14 LAB — LIPASE, BLOOD: Lipase: 42 U/L (ref 11–51)

## 2024-08-14 LAB — TROPONIN T, HIGH SENSITIVITY
Troponin T High Sensitivity: <15 ng/L (ref 0–19)
Troponin T High Sensitivity: <15 ng/L (ref 0–19)

## 2024-08-14 MED ORDER — LIDOCAINE VISCOUS HCL 2 % MT SOLN
15.0000 mL | Freq: Once | OROMUCOSAL | Status: AC
  Administered 2024-08-14: 15 mL via ORAL
  Filled 2024-08-14: qty 15

## 2024-08-14 MED ORDER — INSULIN DETEMIR 100 UNIT/ML FLEXPEN: 32.0000 [IU] | PEN_INJECTOR | Freq: Every day | SUBCUTANEOUS | 11 refills | Status: AC

## 2024-08-14 MED ORDER — INSULIN LISPRO (1 UNIT DIAL) 100 UNIT/ML (KWIKPEN): PEN_INJECTOR | SUBCUTANEOUS | 11 refills | Status: AC

## 2024-08-14 MED ORDER — INSULIN ASPART 100 UNIT/ML IJ SOLN
10.0000 [IU] | Freq: Once | INTRAMUSCULAR | Status: AC
  Administered 2024-08-14: 10 [IU] via SUBCUTANEOUS
  Filled 2024-08-14: qty 10

## 2024-08-14 MED ORDER — ALUM & MAG HYDROXIDE-SIMETH 200-200-20 MG/5ML PO SUSP
30.0000 mL | Freq: Once | ORAL | Status: AC
  Administered 2024-08-14: 30 mL via ORAL
  Filled 2024-08-14: qty 30

## 2024-08-14 NOTE — ED Notes (Signed)
 Lab called about Lipase add-on

## 2024-08-14 NOTE — Discharge Instructions (Addendum)
 Glucose was appropriately downtrending in the emergency department.  Your cardiac workup was overall reassuring.  You were fluid resuscitated.  Recommend you stop Ozempic, follow-up with your endocrinologist for continued outpatient management.  Sliding scale insulin  has been prescribed for outpatient short-term blood glucose management.  Your long-acting insulin  has also been represcribed.  Please closely monitor your blood glucose while reinitiating this regimen.  Check blood sugars 3 times a day before meals. CBG 151-200: 4 units CBG 201-250: 7 units CBG 251-300: 11 units CBG 301-350: 15 units CBG 351-400: 20 units
